# Patient Record
Sex: Female | Born: 1940 | Race: Black or African American | Hispanic: No | Marital: Single | Smoking: Former smoker
Health system: Southern US, Community
[De-identification: ages and names within clinical notes are randomized; demographics above are authoritative.]

## PROBLEM LIST (undated history)

## (undated) DIAGNOSIS — E785 Hyperlipidemia, unspecified: Secondary | ICD-10-CM

## (undated) DIAGNOSIS — I10 Essential (primary) hypertension: Secondary | ICD-10-CM

## (undated) DIAGNOSIS — G4730 Sleep apnea, unspecified: Secondary | ICD-10-CM

## (undated) DIAGNOSIS — J449 Chronic obstructive pulmonary disease, unspecified: Secondary | ICD-10-CM

## (undated) HISTORY — PX: DILATION AND CURETTAGE OF UTERUS: SHX78

## (undated) HISTORY — DX: Essential (primary) hypertension: I10

## (undated) HISTORY — DX: Sleep apnea, unspecified: G47.30

## (undated) HISTORY — PX: APPENDECTOMY: SHX54

## (undated) HISTORY — DX: Hyperlipidemia, unspecified: E78.5

---

## 2020-03-21 ENCOUNTER — Institutional Professional Consult (permissible substitution): Payer: Medicare Other | Admitting: Pulmonary Disease

## 2020-04-16 ENCOUNTER — Other Ambulatory Visit: Payer: Self-pay

## 2020-04-16 ENCOUNTER — Ambulatory Visit (INDEPENDENT_AMBULATORY_CARE_PROVIDER_SITE_OTHER): Payer: Medicare Other | Admitting: Pulmonary Disease

## 2020-04-16 ENCOUNTER — Encounter: Payer: Self-pay | Admitting: Pulmonary Disease

## 2020-04-16 VITALS — BP 138/78 | HR 94 | Temp 99.5°F | Ht 64.0 in | Wt 215.0 lb

## 2020-04-16 DIAGNOSIS — G4733 Obstructive sleep apnea (adult) (pediatric): Secondary | ICD-10-CM | POA: Insufficient documentation

## 2020-04-16 DIAGNOSIS — J449 Chronic obstructive pulmonary disease, unspecified: Secondary | ICD-10-CM

## 2020-04-16 DIAGNOSIS — J9611 Chronic respiratory failure with hypoxia: Secondary | ICD-10-CM | POA: Insufficient documentation

## 2020-04-16 DIAGNOSIS — Z9989 Dependence on other enabling machines and devices: Secondary | ICD-10-CM

## 2020-04-16 NOTE — Assessment & Plan Note (Signed)
We spent about 20 minutes troubleshooting her CPAP machine.  I decreased the ramp time to 0 minutes, her CPAP is set at 18 cm, I showed her how to set up the oxygen connection to her CPAP hose so that she does not have a leak.  I feel that she should be able to use her machine with these adjustments.I have demonstrated her her nasal mask and hose are new.  We will send in a prescription to [facility] to provide her new supplies. We will also try to obtain her old study from [facility] or from [place] We will also try to obtain a CPAP download on her machine at 18 cm and make adjustments if needed  Weight loss encouraged, compliance with goal of at least 4-6 hrs every night is the expectation. Advised against medications with sedative side effects Cautioned against driving when sleepy - understanding that sleepiness will vary on a day to day basis

## 2020-04-16 NOTE — Assessment & Plan Note (Signed)
She is maintained on 3 L of oxygen. In the future we can consider POC

## 2020-04-16 NOTE — Patient Instructions (Signed)
Prescription to [facility] to renew CPAP supplies and obtain download on CPAP machine.  Obtain sleep studies from [place] or from [place] [facility] in [place] PFTs

## 2020-04-16 NOTE — Progress Notes (Addendum)
   Subjective:    Patient ID: Jody Elliott, female    DOB: 04-17-1941, 79 y.o.   MRN: [id number]  HPI  This is a 79 year old woman presents to establish care for COPD and obstructive sleep apnea.  She is a resident at [facility] assisted living facility at [place]. She had 2 sleep studies done in [place] and one in [place] but none of these are available to me at the time of dictation.  She brings her CPAP machine with me and has a lot of issues related to the machine, I note that the machine is set at 18 cm with a ramp time of 15 minutes.  She has a nasal mask.  She has an attachment for oxygen to [facility] but this does not fit well with the house she used to live in [place] and had a prolonged hospitalization and moved to [place] with her son where she had another hospitalization at [facility].  She apparently had an episode of mechanical ventilation and has been maintained on 3 L of oxygen since then. She has a leak from the hose and repeated nocturnal awakenings related to this  Epworth sleepiness score is 7 Bedtime is around 10 PM , sleep latency is minimal she reports 1-2 nocturnal awakenings and is out of bed at 6:45 AM with dryness of mouth in spite of using her humidifier She reports increased bipedal edema but denies wheezing.  She is maintained on a regimen of Spiriva and Xopenex nebs. She takes 20 mg of Lasix daily.  Medication review shows lisinopril   Significant tests/ events reviewed Split NPSG 09/2017 ([place]) >> AHI 55/h, >> CPAP 16 cm + 3 L O2 >> inadequate titration >> 18 cm recommended ONO 10/2017 >> on RA, sig desaturations   Past Medical History:  Diagnosis Date  . Hyperlipidemia   . Hypertension   . Sleep apnea    History of appendectomy No Known Allergies  Social -she now lives in assisted living, she has family in [place], her son lives in [place], she smoked about a pack per day before she quit in 2019, about 60 pack years,  she quit alcohol in 2020  Review of Systems Shortness of breath with activity,  weight gain of 10 pounds,  joint stiffness,  feet swelling    Objective:   Physical Exam  [place],elderly, obese, in no distress ENT - no lesions, no post nasal drip Neck: No JVD, no thyromegaly, no carotid bruits Lungs: no use of accessory muscles, no dullness to percussion, decreased without rales or rhonchi  Cardiovascular: Rhythm regular, heart sounds  normal, no murmurs or gallops, no peripheral edema Musculoskeletal: No deformities, no cyanosis or clubbing , no tremors        Assessment & Plan:

## 2020-04-16 NOTE — Assessment & Plan Note (Signed)
We will try to obtain her PFTs from [place] fear, if unable to obtain then we will redo spirometry pre and post and obtain chest x-ray. Meanwhile she will continue on Spiriva and Xopenex nebs. If needed we will consider stepping up to triple therapy

## 2020-10-30 ENCOUNTER — Ambulatory Visit: Payer: Medicare Other | Admitting: Pulmonary Disease

## 2020-11-08 ENCOUNTER — Encounter: Payer: Self-pay | Admitting: Adult Health

## 2020-11-08 ENCOUNTER — Other Ambulatory Visit: Payer: Self-pay

## 2020-11-08 ENCOUNTER — Ambulatory Visit (INDEPENDENT_AMBULATORY_CARE_PROVIDER_SITE_OTHER): Payer: Medicare Other | Admitting: Adult Health

## 2020-11-08 ENCOUNTER — Ambulatory Visit
Admission: RE | Admit: 2020-11-08 | Discharge: 2020-11-08 | Disposition: A | Discharge status: Home / Self Care | Payer: Medicare Other | Source: Ambulatory Visit | Attending: Adult Health | Admitting: Adult Health

## 2020-11-08 VITALS — BP 132/72 | HR 104 | Temp 97.5°F | Ht <= 58 in | Wt 225.1 lb

## 2020-11-08 DIAGNOSIS — R0602 Shortness of breath: Secondary | ICD-10-CM | POA: Insufficient documentation

## 2020-11-08 DIAGNOSIS — G4733 Obstructive sleep apnea (adult) (pediatric): Secondary | ICD-10-CM | POA: Diagnosis not present

## 2020-11-08 DIAGNOSIS — J9611 Chronic respiratory failure with hypoxia: Secondary | ICD-10-CM

## 2020-11-08 DIAGNOSIS — J449 Chronic obstructive pulmonary disease, unspecified: Secondary | ICD-10-CM | POA: Diagnosis not present

## 2020-11-08 DIAGNOSIS — Z9989 Dependence on other enabling machines and devices: Secondary | ICD-10-CM

## 2020-11-08 NOTE — Patient Instructions (Addendum)
Continue on Spiriva 1 puff daily  Continue on Oxygen 3l/m  Continue on CPAP At bedtime  , wear all night .  Work on healthy weight .  Activity as tolerated  Follow up with Dr. Vassie Loll  In 6 months and As needed   Chest xray today .

## 2020-11-08 NOTE — Assessment & Plan Note (Signed)
Continue on CPAP at bedtime.  CPAP download requested  Plan  Patient Instructions  Continue on Spiriva 1 puff daily  Continue on Oxygen 3l/m  Continue on CPAP At bedtime  , wear all night .  Work on healthy weight .  Activity as tolerated  Follow up with Dr. Vassie Loll  In 6 months and As needed   Chest xray today .

## 2020-11-08 NOTE — Assessment & Plan Note (Signed)
Appears compensated on present regimen. Chest x-ray today  Plan  Patient Instructions  Continue on Spiriva 1 puff daily  Continue on Oxygen 3l/m  Continue on CPAP At bedtime  , wear all night .  Work on healthy weight .  Activity as tolerated  Follow up with Dr. Vassie Loll  In 6 months and As needed   Chest xray today .

## 2020-11-08 NOTE — Progress Notes (Signed)
@Patient  ID: , female    DOB: 03-19-1941, 80 y.o.   MRN: [id number]  Chief Complaint  Patient presents with  . Follow-up    Referring provider: [id number]*  HPI: 80 year old female former smoker seen for pulmonary consult April 16, 2020 to establish for COPD and obstructive sleep apnea Lives in assisted living facility. Previous history of critical illness during hospitalization with acute respiratory failure requiring mechanical ventilatory support and oxygen.  TEST/EVENTS :  Split NPSG 09/2017 ([place]) >> AHI 55/h, >> CPAP 16 cm + 3 L O2 >> inadequate titration >> 18 cm recommended ONO 10/2017 >> on RA, sig desaturations   11/08/2020 Follow up : COPD , OSA, O2 RF  Patient returns for a 74-month follow-up.  Patient has underlying COPD. Says overall breathing doing okay since last visit. No ER or hospitalizations. Likes her Assisted living . Walks with rolling walker with bench. Remains on Oxygen 3l/m 24/7 .  She remains on Spiriva daily., has been on this for long time.  Is on ACE inhibitor but denies significant cough. Covid vaccine up to date.   She has obstructive sleep apnea is on nocturnal CPAP.  Wears CPAP each night for 8-9 hr each night . Says it works well. Does not sleep without it .  CPAP download requested.   Has Hypertension and chronic ankle edema on ACE inhibitor and Lasix . Denies increased leg swelling .   Appetite is good , likes to eat.     No Known Allergies   There is no immunization history on file for this patient.  Past Medical History:  Diagnosis Date  . Hyperlipidemia   . Hypertension   . Sleep apnea     Tobacco History: Social History   Tobacco Use  Smoking Status Former Smoker  . Packs/day: 1.00  . Years: 20.00  . Pack years: 20.00  . Types: Cigarettes  . Quit date: 2020  . Years since quitting: 2.2  Smokeless Tobacco Never Used   Counseling given: Not Answered   Outpatient Medications Prior to  Visit  Medication Sig Dispense Refill  . acetaminophen (TYLENOL) 325 MG tablet Take 650 mg by mouth every 6 (six) hours as needed.    2021 aspirin EC 81 MG tablet Take 81 mg by mouth daily. Swallow whole.    . Cholecalciferol (VITAMIN D3) 10 MCG (400 UNIT) CAPS Take by mouth.    . furosemide (LASIX) 20 MG tablet Take 20 mg by mouth daily.    Marland Kitchen guaifenesin (ROBITUSSIN) 100 MG/5ML syrup Take 200 mg by mouth 3 (three) times daily as needed for cough.    . levalbuterol (XOPENEX) 1.25 MG/0.5ML nebulizer solution Take 1.25 mg by nebulization every 4 (four) hours as needed for wheezing or shortness of breath.    . lisinopril (ZESTRIL) 10 MG tablet Take 10 mg by mouth daily.    . Magnesium Hydroxide (MILK OF MAGNESIA PO) Take 400 mg by mouth.    . pravastatin (PRAVACHOL) 20 MG tablet Take 20 mg by mouth daily.    Marland Kitchen SPIRIVA HANDIHALER 18 MCG inhalation capsule 1 capsule daily.     No facility-administered medications prior to visit.     Review of Systems:   Constitutional:   No  weight loss, night sweats,  Fevers, chills,  +fatigue, or  lassitude.  HEENT:   No headaches,  Difficulty swallowing,  Tooth/dental problems, or  Sore throat,  No sneezing, itching, ear ache, nasal congestion, post nasal drip,   CV:  No chest pain,  Orthopnea, PND, swelling in lower extremities, anasarca, dizziness, palpitations, syncope.   GI  No heartburn, indigestion, abdominal pain, nausea, vomiting, diarrhea, change in bowel habits, loss of appetite, bloody stools.   Resp:    No chest wall deformity  Skin: no rash or lesions.  GU: no dysuria, change in color of urine, no urgency or frequency.  No flank pain, no hematuria   MS:  No joint pain or swelling.  No decreased range of motion.  No back pain.    Physical Exam  BP 132/72 (BP Location: Left Arm, Cuff Size: Normal)   Pulse (!) 104   Temp (!) 97.5 F (36.4 C) (Temporal)   Ht 4\' 10"  (1.473 m)   Wt 225 lb 1.6 oz (102.1 kg)   SpO2 100%    BMI 47.05 kg/m   GEN: A/Ox3; pleasant , NAD, elderly on o2 , rolling walker    HEENT:  [place]/AT,   NOSE-clear, THROAT-clear, no lesions, no postnasal drip or exudate noted.   NECK:  Supple w/ fair ROM; no JVD; normal carotid impulses w/o bruits; no thyromegaly or nodules palpated; no lymphadenopathy.    RESP  Clear  P & A; w/o, wheezes/ rales/ or rhonchi. no accessory muscle use, no dullness to percussion  CARD:  RRR, no m/r/g, 1+ peripheral edema, pulses intact, no cyanosis or clubbing.  GI:   Soft & nt; nml bowel sounds; no organomegaly or masses detected.   Musco: Warm bil, no deformities or joint swelling noted.   Neuro: alert, no focal deficits noted.    Skin: Warm, no lesions or rashes    Lab Results:  CBC No results found for: WBC, RBC, HGB, HCT, PLT, MCV, MCH, MCHC, RDW, LYMPHSABS, MONOABS, EOSABS, BASOSABS  BMET No results found for: NA, K, CL, CO2, GLUCOSE, BUN, CREATININE, CALCIUM, GFRNONAA, GFRAA  BNP No results found for: BNP  ProBNP No results found for: PROBNP  Imaging: No results found.    No flowsheet data found.  No results found for: NITRICOXIDE      Assessment & Plan:   COPD with chronic bronchitis and emphysema (HCC) Appears compensated on present regimen. Chest x-ray today  Plan  Patient Instructions  Continue on Spiriva 1 puff daily  Continue on Oxygen 3l/m  Continue on CPAP At bedtime  , wear all night .  Work on healthy weight .  Activity as tolerated  Follow up with Dr.  In 6 months and As needed   Chest xray today .     OSA on CPAP Continue on CPAP at bedtime.  CPAP download requested  Plan  Patient Instructions  Continue on Spiriva 1 puff daily  Continue on Oxygen 3l/m  Continue on CPAP At bedtime  , wear all night .  Work on healthy weight .  Activity as tolerated  Follow up with Dr. Vassie Loll  In 6 months and As needed   Chest xray today .      Chronic respiratory failure with hypoxia (HCC) Compensated on  oxygen  Plan  Patient Instructions  Continue on Spiriva 1 puff daily  Continue on Oxygen 3l/m  Continue on CPAP At bedtime  , wear all night .  Work on healthy weight .  Activity as tolerated  Follow up with Dr. Vassie Loll  In 6 months and As needed   Chest xray today .  Rubye Oaks, NP 11/08/2020

## 2020-11-08 NOTE — Assessment & Plan Note (Signed)
Compensated on oxygen  Plan  Patient Instructions  Continue on Spiriva 1 puff daily  Continue on Oxygen 3l/m  Continue on CPAP At bedtime  , wear all night .  Work on healthy weight .  Activity as tolerated  Follow up with Dr. Vassie Loll  In 6 months and As needed   Chest xray today .

## 2020-11-13 ENCOUNTER — Telehealth: Payer: Self-pay | Admitting: *Deleted

## 2020-11-13 NOTE — Telephone Encounter (Signed)
ATC patient x1, patient was out at the time of my call.

## 2020-11-15 NOTE — Progress Notes (Signed)
Called and spoke with patient, provided results/recommendations per Tammy Parrett NP.  She verbalized understanding.  Nothing further needed.

## 2021-03-03 ENCOUNTER — Ambulatory Visit (INDEPENDENT_AMBULATORY_CARE_PROVIDER_SITE_OTHER): Payer: Medicare Other | Admitting: Internal Medicine

## 2021-03-03 ENCOUNTER — Other Ambulatory Visit: Payer: Self-pay

## 2021-03-03 ENCOUNTER — Encounter: Payer: Self-pay | Admitting: Internal Medicine

## 2021-03-03 VITALS — BP 124/70 | HR 106 | Ht 63.0 in | Wt 223.0 lb

## 2021-03-03 DIAGNOSIS — R079 Chest pain, unspecified: Secondary | ICD-10-CM | POA: Diagnosis not present

## 2021-03-03 DIAGNOSIS — I5043 Acute on chronic combined systolic (congestive) and diastolic (congestive) heart failure: Secondary | ICD-10-CM

## 2021-03-03 DIAGNOSIS — R609 Edema, unspecified: Secondary | ICD-10-CM | POA: Diagnosis not present

## 2021-03-03 DIAGNOSIS — J9611 Chronic respiratory failure with hypoxia: Secondary | ICD-10-CM

## 2021-03-03 NOTE — Patient Instructions (Signed)
Medication Instructions:  Your physician recommends that you continue on your current medications as directed. Please refer to the Current Medication list given to you today.  *If you need a refill on your cardiac medications before your next appointment, please call your pharmacy*   Lab Work: CBC BMET TSH LIPID BNP If you have labs (blood work) drawn today and your tests are completely normal, you will receive your results only by: MyChart Message (if you have MyChart) OR A paper copy in the mail If you have any lab test that is abnormal or we need to change your treatment, we will call you to review the results.   Testing/Procedures: Your physician has requested that you have an echocardiogram. Echocardiography is a painless test that uses sound waves to create images of your heart. It provides your doctor with information about the size and shape of your heart and how well your heart's chambers and valves are working. This procedure takes approximately one hour. There are no restrictions for this procedure.    Follow-Up: At [facility], you and your health needs are our priority.  As part of our continuing mission to provide you with exceptional heart care, we have created designated Provider Care Teams.  These Care Teams include your primary Cardiologist (physician) and Advanced Practice Providers (APPs -  Physician Assistants and Nurse Practitioners) who all work together to provide you with the care you need, when you need it.  We recommend signing up for the patient portal called "MyChart".  Sign up information is provided on this After Visit Summary.  MyChart is used to connect with patients for Virtual Visits (Telemedicine).  Patients are able to view lab/test results, encounter notes, upcoming appointments, etc.  Non-urgent messages can be sent to your provider as well.   To learn more about what you can do with MyChart, go to [email or web address].    Your next  appointment:   Follow Up: TO BE DETERMINED   Other Instructions:

## 2021-03-03 NOTE — Progress Notes (Signed)
Cardiology Office Note   Date:  03/03/2021   ID:  [name], DOB 1941-06-17, MRN [id number]  PCP:  Lonell Grandchild, NP  Cardiologist:   Dietrich Pates, MD   Pt referred for evaluation of possible CHF     History of Present Illness: Jody Elliott is a 80 y.o. female with a history of COPD and reporte CHF   ferred for continued cardiac care   The pt denies any prior cardiac hx    She has intermitt CP   Worse with lying at night   Questions if it is reflux    She does get SOB with activity but she is on 3L O2    Not too active    Had CXR this srping that showed some vascular congestion (ordered by pulmonary)       Current Meds  Medication Sig   acetaminophen (TYLENOL) 325 MG tablet Take 650 mg by mouth every 6 (six) hours as needed.   aspirin EC 81 MG tablet Take 81 mg by mouth daily. Swallow whole.   Cholecalciferol (VITAMIN D3) 10 MCG (400 UNIT) CAPS Take by mouth.   famotidine (PEPCID) 40 MG tablet Take 40 mg by mouth daily.   furosemide (LASIX) 20 MG tablet Take 20 mg by mouth daily.   guaifenesin (ROBITUSSIN) 100 MG/5ML syrup Take 200 mg by mouth 3 (three) times daily as needed for cough.   levalbuterol (XOPENEX) 1.25 MG/0.5ML nebulizer solution Take 1.25 mg by nebulization every 4 (four) hours as needed for wheezing or shortness of breath.   lisinopril (ZESTRIL) 10 MG tablet Take 10 mg by mouth daily.   Magnesium Hydroxide (MILK OF MAGNESIA PO) Take 400 mg by mouth.   pravastatin (PRAVACHOL) 20 MG tablet Take 20 mg by mouth daily.   SPIRIVA HANDIHALER 18 MCG inhalation capsule 1 capsule daily.   VENTOLIN HFA 108 (90 Base) MCG/ACT inhaler Inhale 2 puffs into the lungs every 4 (four) hours as needed.     Allergies:   Patient has no known allergies.   Past Medical History:  Diagnosis Date   Hyperlipidemia    Hypertension    Sleep apnea     Past Surgical History:  Procedure Laterality Date   APPENDECTOMY     DILATION AND CURETTAGE OF UTERUS       Social  History:  The patient  reports that she quit smoking about 2 years ago. Her smoking use included cigarettes. She has a 20.00 pack-year smoking history. She has never used smokeless tobacco. She reports previous alcohol use. She reports that she does not use drugs.   Family History:  The patient's family history includes Cancer in her father; Diabetes in her father and mother.    ROS:  Please see the history of present illness. All other systems are reviewed and  Negative to the above problem except as noted.    PHYSICAL EXAM: VS:  BP 124/70 (BP Location: Right Arm)   Pulse (!) 106   Ht 5\' 3"  (1.6 m)   Wt 223 lb (101.2 kg)   SpO2 99%   BMI 39.50 kg/m   GEN: Morbidly obese 80 yo in no acute distress  HEENT: normal  Neck: no JVD, carotid bruits Cardiac: RRR; no murmurs, rubs, or gallops   1+ LE edema  Respiratory:  clear to auscultation bilaterally   GI: soft, nontender, nondistended, + BS  No hepatomegaly  MS: no deformity Moving all extremities   Skin: warm and dry, no rash Neuro:  Strength and sensation are intact Psych: euthymic mood, full affect   EKG:  EKG is ordered today.  ST 101 bpm      Lipid Panel No results found for: CHOL, TRIG, HDL, CHOLHDL, VLDL, LDLCALC, LDLDIRECT    Wt Readings from Last 3 Encounters:  03/03/21 223 lb (101.2 kg)  11/08/20 225 lb 1.6 oz (102.1 kg)  04/16/20 215 lb (97.5 kg)      ASSESSMENT AND PLAN:  1  Question CHF   No work up in the past   CXR with some vascular congestion.  May reflect body habitus    I would recommm an echo to confirm Would get CBC, BMET and BNP    Watch Na   2  COPD  Severe   Will try to get into pulmonmary clinic sooner   3  OSA   Pulm needs to review CPAP   4 HCM  Check TSH and lipids   F/U base on test results     Current medicines are reviewed at length with the patient today.  The patient does not have concerns regarding medicines.  Signed, Dietrich Pates, MD  03/03/2021 9:18 AM    [facility] [address] [place], [place], [place]  [address] Phone: [phone]; Fax: [phone]

## 2021-03-12 ENCOUNTER — Ambulatory Visit (HOSPITAL_COMMUNITY): Admission: RE | Admit: 2021-03-12 | Payer: Medicare Other | Source: Ambulatory Visit

## 2021-03-21 ENCOUNTER — Ambulatory Visit: Payer: Medicare Other | Admitting: Adult Health

## 2021-03-25 ENCOUNTER — Ambulatory Visit (INDEPENDENT_AMBULATORY_CARE_PROVIDER_SITE_OTHER): Payer: Medicare Other | Admitting: Primary Care

## 2021-03-25 ENCOUNTER — Other Ambulatory Visit: Payer: Self-pay

## 2021-03-25 ENCOUNTER — Encounter: Payer: Self-pay | Admitting: Primary Care

## 2021-03-25 ENCOUNTER — Ambulatory Visit (INDEPENDENT_AMBULATORY_CARE_PROVIDER_SITE_OTHER): Payer: Medicare Other

## 2021-03-25 VITALS — BP 128/80 | HR 112 | Temp 98.1°F | Ht 63.0 in | Wt 224.0 lb

## 2021-03-25 DIAGNOSIS — R0602 Shortness of breath: Secondary | ICD-10-CM

## 2021-03-25 DIAGNOSIS — Z9989 Dependence on other enabling machines and devices: Secondary | ICD-10-CM

## 2021-03-25 DIAGNOSIS — I509 Heart failure, unspecified: Secondary | ICD-10-CM | POA: Diagnosis not present

## 2021-03-25 DIAGNOSIS — J449 Chronic obstructive pulmonary disease, unspecified: Secondary | ICD-10-CM | POA: Diagnosis not present

## 2021-03-25 DIAGNOSIS — G4733 Obstructive sleep apnea (adult) (pediatric): Secondary | ICD-10-CM

## 2021-03-25 LAB — BRAIN NATRIURETIC PEPTIDE: Pro B Natriuretic peptide (BNP): 46 pg/mL (ref 0.0–100.0)

## 2021-03-25 MED ORDER — PREDNISONE 10 MG PO TABS: ORAL_TABLET | ORAL | 0 refills | Status: DC

## 2021-03-25 MED ORDER — AMOXICILLIN-POT CLAVULANATE 875-125 MG PO TABS: 1.0000 | ORAL_TABLET | Freq: Two times a day (BID) | ORAL | 0 refills | Status: DC

## 2021-03-25 NOTE — Progress Notes (Signed)
Please let patient know BNP was normal, no change recommended to diuretics. CXR showed mild bibasilar opacities which could represent atelectasis, aspiration or pneumonia. Recommend treating with course Augmentin 1 tab BID x 7 days (ill send). Continue prednisone as prescribed.

## 2021-03-25 NOTE — Patient Instructions (Addendum)
Seen today for wheezing, suspect there is an underlying component of heart failure. You need to rescheduled echocardiogram with cardiology. We will put you on a short course or oral steroids to see if this helps with wheezing. Continue Spiriva handihaler, as needed levalbuterol, supplemental oxygen and CPAP.   Orders: Labs (BNP today) CXR today Spirometry in 4 weeks    Follow-up: 4 weeks with Dr. Vassie Loll with spirometry and FENO (sept 20, 21 or 22)  / bring vaccination card with you to next appointment

## 2021-03-25 NOTE — Assessment & Plan Note (Addendum)
-   Patient has had increased wheezing over the last several week without any other respiratory symptoms. COPD does not appear overtly exacerbated but she did have some wheezing on exam. We will send in 5 days course of oral prednisone and check CXR and BNP today. Concern for underlying heart failure, she has leg swelling. She is compliant with Lasix 20mg . She inadvertently missed echocardiogram scheduled for July. Recommend we check spirometry with FENO at follow-up in 4 weeks (she will need to bring in her vaccination card with her). Continue Spiriva handihaler, as needed levalbuterol, supplemental oxygen at 3L/min.

## 2021-03-25 NOTE — Addendum Note (Signed)
Addended by: Glenford Bayley on: 03/25/2021 04:42 PM   Modules accepted: Orders

## 2021-03-25 NOTE — Progress Notes (Signed)
CXR showed mild bibasilar opacities which could represent atelectasis, aspiration or pneumonia. Mild cardiomegaly and hyperinflation. Due to acute wheezing we will treat for suspected aspiration/pna with Augmentin 875-125mg  BID x 7 days. BNP was normal- not adjusted to diuretics needed.

## 2021-03-25 NOTE — Progress Notes (Signed)
@Patient  ID: , female    DOB: [birth year], 80 y.o.   MRN: [id number]  Chief Complaint  Patient presents with   Follow-up    Patient reports shortness of breath and wheezing worse in last few weeks.      Referring provider: [id number], NP  HPI: 80 year old female former smoker seen for pulmonary consult April 16, 2020 to establish for COPD and obstructive sleep apnea. Patient of Dr. April 18, 2020, last seen by NP in March 2022. She lives in assisted living facility.   Previous [facility] pulmonary encounter: 11/08/2020 Follow up : COPD , OSA, O2 RF  Patient returns for a 70-month follow-up.  Patient has underlying COPD. Says overall breathing doing okay since last visit. No ER or hospitalizations. Likes her Assisted living . Walks with rolling walker with bench. Remains on Oxygen 3l/m 24/7 .  She remains on Spiriva daily., has been on this for long time.  Is on ACE inhibitor but denies significant cough. Covid vaccine up to date.   She has obstructive sleep apnea is on nocturnal CPAP.  Wears CPAP each night for 8-9 hr each night . Says it works well. Does not sleep without it .  CPAP download requested.   Has Hypertension and chronic ankle edema on ACE inhibitor and Lasix . Denies increased leg swelling .   Appetite is good , likes to eat.   03/25/2021- interim hx  Patient presents today for acute visit, cardiology wanted her to be seen d/t wheezing. She feels well today. She denies any increase in shortness of breath, chest tightness or cough. She is compliant with Spiriva Handihaler. She wears 3L oxygen continuously during the day and CPAP at night. No compliance report available today. She wears CPAP every night for approximately for 8 hours. She got a new headgear which she states does not fit like the original. She is taking lasix 20mg  daily. She has some leg swelling. Question of heat failure, no work up in the past. Appears she missed an echocardiogram scheduled in July.     TEST/EVENTS :  Split NPSG 09/2017 ([place]) >> AHI 55/h, >> CPAP 16 cm + 3 L O2 >> inadequate titration >> 18 cm recommended ONO 10/2017 >> on RA, sig desaturations  CXR- mild cardiomegaly with cedntral pulmonary vascular congestion. Mild bibasilar opacities are noted concerning for subsegmental atelectasis or possible edema.   No Known Allergies   There is no immunization history on file for this patient.  Past Medical History:  Diagnosis Date   Hyperlipidemia    Hypertension    Sleep apnea     Tobacco History: Social History   Tobacco Use  Smoking Status Former   Packs/day: 1.00   Years: 20.00   Pack years: 20.00   Types: Cigarettes   Quit date: 2020   Years since quitting: 2.6  Smokeless Tobacco Never   Counseling given: Not Answered   Outpatient Medications Prior to Visit  Medication Sig Dispense Refill   acetaminophen (TYLENOL) 325 MG tablet Take 650 mg by mouth every 6 (six) hours as needed.     aspirin EC 81 MG tablet Take 81 mg by mouth daily. Swallow whole.     Cholecalciferol (VITAMIN D3) 10 MCG (400 UNIT) CAPS Take by mouth.     famotidine (PEPCID) 40 MG tablet Take 40 mg by mouth daily.     furosemide (LASIX) 20 MG tablet Take 20 mg by mouth daily.     guaifenesin (ROBITUSSIN) 100 MG/5ML syrup Take 200  mg by mouth 3 (three) times daily as needed for cough.     latanoprost (XALATAN) 0.005 % ophthalmic solution SMARTSIG:In Eye(s)     levalbuterol (XOPENEX) 1.25 MG/0.5ML nebulizer solution Take 1.25 mg by nebulization every 4 (four) hours as needed for wheezing or shortness of breath.     lisinopril (ZESTRIL) 10 MG tablet Take 10 mg by mouth daily.     Magnesium Hydroxide (MILK OF MAGNESIA PO) Take 400 mg by mouth.     pravastatin (PRAVACHOL) 20 MG tablet Take 20 mg by mouth daily.     SPIRIVA HANDIHALER 18 MCG inhalation capsule 1 capsule daily.     VENTOLIN HFA 108 (90 Base) MCG/ACT inhaler Inhale 2 puffs into the lungs every 4 (four) hours as  needed.     amoxicillin-clavulanate (AUGMENTIN) 500-125 MG tablet Take 1 tablet by mouth 2 (two) times daily. (Patient not taking: Reported on 03/25/2021)     chlorhexidine (PERIDEX) 0.12 % solution SMARTSIG:By Mouth (Patient not taking: Reported on 03/25/2021)     No facility-administered medications prior to visit.    Review of Systems  Review of Systems  Constitutional: Negative.   HENT: Negative.    Respiratory:  Positive for wheezing. Negative for cough, chest tightness and shortness of breath.   Cardiovascular:  Positive for leg swelling.    Physical Exam  BP 128/80 (BP Location: Left Arm, Patient Position: Sitting, Cuff Size: Normal)   Pulse (!) 112   Temp 98.1 F (36.7 C) (Oral)   Ht 5\' 3"  (1.6 m)   Wt 224 lb (101.6 kg)   SpO2 99%   BMI 39.68 kg/m  Physical Exam Constitutional:      Appearance: Normal appearance.  HENT:     Mouth/Throat:     Comments: Deferred d/t masking Cardiovascular:     Rate and Rhythm: Normal rate and regular rhythm.     Comments: +1BLE edema, L>R Pulmonary:     Effort: Pulmonary effort is normal.     Breath sounds: Wheezing present.  Musculoskeletal:        General: Normal range of motion.     Comments: Rolling walker   Skin:    General: Skin is warm and dry.  Neurological:     General: No focal deficit present.     Mental Status: She is alert and oriented to person, place, and time. Mental status is at baseline.  Psychiatric:        Mood and Affect: Mood normal.        Behavior: Behavior normal.        Thought Content: Thought content normal.        Judgment: Judgment normal.     Lab Results:  CBC No results found for: WBC, RBC, HGB, HCT, PLT, MCV, MCH, MCHC, RDW, LYMPHSABS, MONOABS, EOSABS, BASOSABS  BMET No results found for: NA, K, CL, CO2, GLUCOSE, BUN, CREATININE, CALCIUM, GFRNONAA, GFRAA  BNP No results found for: BNP  ProBNP No results found for: PROBNP  Imaging: No results found.   Assessment & Plan:   COPD  with chronic bronchitis and emphysema (HCC) - Patient has had increased wheezing over the last several week without any other respiratory symptoms. COPD does not appear overtly exacerbated but she did have some wheezing on exam. We will send in 5 days course of oral prednisone and check CXR and BNP today. Concern for underlying heart failure, she has leg swelling. She is compliant with Lasix 20mg . She inadvertently missed echocardiogram scheduled for July. Recommend  we check spirometry with [facility] at follow-up in 4 weeks (she will need to bring in her vaccination card with her). Continue Spiriva handihaler, as needed levalbuterol, supplemental oxygen at 3L/min.     OSA on CPAP - Split NPSG 2019 showed severe OSA, AHI 55/hr - No compliance reports available today, she will need to bring in SD card to next visit or bring it by DME company    Glenford Bayley, NP 03/25/2021

## 2021-03-25 NOTE — Assessment & Plan Note (Signed)
-   Split NPSG 2019 showed severe OSA, AHI 55/hr - No compliance reports available today, she will need to bring in SD card to next visit or bring it by DME company

## 2021-04-17 ENCOUNTER — Other Ambulatory Visit: Payer: Self-pay

## 2021-04-17 ENCOUNTER — Ambulatory Visit (INDEPENDENT_AMBULATORY_CARE_PROVIDER_SITE_OTHER): Payer: Medicare Other | Admitting: Obstetrics & Gynecology

## 2021-04-17 ENCOUNTER — Encounter: Payer: Self-pay | Admitting: Obstetrics & Gynecology

## 2021-04-17 ENCOUNTER — Other Ambulatory Visit (HOSPITAL_COMMUNITY)
Admission: RE | Admit: 2021-04-17 | Discharge: 2021-04-17 | Disposition: A | Discharge status: Home / Self Care | Payer: Medicare Other | Source: Ambulatory Visit | Attending: Obstetrics & Gynecology | Admitting: Obstetrics & Gynecology

## 2021-04-17 VITALS — BP 122/71 | HR 98 | Ht 63.0 in | Wt 224.0 lb

## 2021-04-17 DIAGNOSIS — N898 Other specified noninflammatory disorders of vagina: Secondary | ICD-10-CM

## 2021-04-17 NOTE — Addendum Note (Signed)
Addended by: Annamarie Dawley on: 04/17/2021 10:47 AM   Modules accepted: Orders

## 2021-04-17 NOTE — Progress Notes (Signed)
   GYN VISIT Patient name: Jody Elliott MRN [id number]  Date of birth: [birth year] Chief Complaint:   Gynecologic Exam  History of Present Illness:   Jody Elliott is a 80 y.o. postmenopausal female being seen today for the following concerns:  Vaginal irritation: She notes burning and irritation for the past month.  +itching and notes the skin burns with urination.  Tried OTC yeast medication which improved only slightly.  She does note that after her shower she will apply cortizone which does help.  She just wasn't sure if this was ok and is concerned that something else is wrong.  No discharge or odor.  No vaginal bleeding. Denies pelvic pain.  Denies dysuria or hematuria.  No other acute complaints  No LMP recorded. Patient is postmenopausal.   Review of Systems:   Pertinent items are noted in HPI Denies fever/chills, dizziness, headaches, visual disturbances, fatigue, shortness of breath, chest pain, abdominal pain, vomiting, bowel movements, urination, or intercourse unless otherwise stated above.  Pertinent History Reviewed:  Reviewed past medical,surgical, social, obstetrical and family history.  Reviewed problem list, medications and allergies. Physical Assessment:   Vitals:   04/17/21 0957  BP: 122/71  Pulse: 98  Weight: 224 lb (101.6 kg)  Height: 5\' 3"  (1.6 m)  Body mass index is 39.68 kg/m.       Physical Examination:   General appearance: alert, well appearing, and in no distress  Psych: mood appropriate, normal affect  Skin: warm & dry   Cardiovascular: normal heart rate noted  Respiratory: normal respiratory effort on supplemental oxygen, no distress  Abdomen: soft, obese, non-tender   Pelvic: VULVA: normal appearing vulva with no masses, tenderness or lesions, VAGINA: normal appearing atrophic vagina with normal color, minimal discharge, no lesions,CERVIX: normal appearing cervix without discharge or lesions  Extremities: 1+ edema  Chaperone:     Assessment & Plan:  1) Vaginal irritation -no acute abnormalities noted on exam -plan to r/o underlying infection and treatment accordingly - suspect dermatitis vs atrophic changes causing irritation.  Reassured pt that there was no masses/lesions or discrete abnormalities noted on exam -ok to continue with topical OTC steroid cream- if this is no longer working or worsening of symptoms, RTC   Return if symptoms worsen or fail to improve.   Faith Rogue, DO Attending Obstetrician & Gynecologist, [facility] for [facility]

## 2021-04-18 LAB — CERVICOVAGINAL ANCILLARY ONLY
Bacterial Vaginitis (gardnerella): NEGATIVE
Candida Glabrata: NEGATIVE
Candida Vaginitis: NEGATIVE
Comment: NEGATIVE
Comment: NEGATIVE
Comment: NEGATIVE

## 2021-04-22 ENCOUNTER — Telehealth: Payer: Self-pay

## 2021-04-22 NOTE — Telephone Encounter (Signed)
Called pt to relay test results. Two identifiers used. Pt confirmed understanding. 

## 2021-04-24 ENCOUNTER — Telehealth: Payer: Self-pay | Admitting: Pulmonary Disease

## 2021-04-24 NOTE — Telephone Encounter (Signed)
ATC to call [facility], was on hold for and no one answered. I have faxed last OV notes and order to number given. Nothing further needed at this time

## 2021-05-05 ENCOUNTER — Other Ambulatory Visit: Payer: Self-pay

## 2021-05-05 ENCOUNTER — Ambulatory Visit (INDEPENDENT_AMBULATORY_CARE_PROVIDER_SITE_OTHER): Payer: Medicare Other | Admitting: Pulmonary Disease

## 2021-05-05 ENCOUNTER — Ambulatory Visit: Payer: Medicare Other | Admitting: Pulmonary Disease

## 2021-05-05 DIAGNOSIS — R0602 Shortness of breath: Secondary | ICD-10-CM | POA: Diagnosis not present

## 2021-05-05 LAB — PULMONARY FUNCTION TEST
FEF 25-75 Pre: 0.31 L/sec
FEF2575-%Pred-Pre: 29 %
FEV1-%Pred-Pre: 57 %
FEV1-Pre: 0.7 L
FEV1FVC-%Pred-Pre: 81 %
FEV6-%Pred-Pre: 75 %
FEV6-Pre: 1.13 L
FEV6FVC-%Pred-Pre: 104 %
FVC-%Pred-Pre: 72 %
FVC-Pre: 1.15 L
Pre FEV1/FVC ratio: 61 %
Pre FEV6/FVC Ratio: 98 %

## 2021-05-05 NOTE — Progress Notes (Signed)
Please let patient know spirometry showed moderate obstructive lung disease, consistent with stage 2 COPD. Recommend changing Spiriva to [employer]. Please send in RX. She has an apt with Dr. Vassie Loll in November.

## 2021-05-05 NOTE — Progress Notes (Signed)
Spirometry done today. 

## 2021-05-06 ENCOUNTER — Other Ambulatory Visit: Payer: Self-pay | Admitting: *Deleted

## 2021-05-06 MED ORDER — STIOLTO RESPIMAT 2.5-2.5 MCG/ACT IN AERS: 2.0000 | INHALATION_SPRAY | Freq: Every day | RESPIRATORY_TRACT | 11 refills | Status: AC

## 2021-05-15 ENCOUNTER — Ambulatory Visit: Payer: Medicare Other | Admitting: Pulmonary Disease

## 2021-06-06 ENCOUNTER — Other Ambulatory Visit: Payer: Self-pay

## 2021-06-06 ENCOUNTER — Ambulatory Visit (HOSPITAL_COMMUNITY)
Admission: RE | Admit: 2021-06-06 | Discharge: 2021-06-06 | Disposition: A | Discharge status: Home / Self Care | Payer: Medicare Other | Source: Ambulatory Visit | Attending: Internal Medicine | Admitting: Internal Medicine

## 2021-06-06 DIAGNOSIS — R609 Edema, unspecified: Secondary | ICD-10-CM | POA: Diagnosis present

## 2021-06-06 DIAGNOSIS — R079 Chest pain, unspecified: Secondary | ICD-10-CM | POA: Insufficient documentation

## 2021-06-06 LAB — ECHOCARDIOGRAM COMPLETE
AR max vel: 1.85 cm2
AV Area VTI: 1.6 cm2
AV Area mean vel: 1.84 cm2
AV Mean grad: 9 mmHg
AV Peak grad: 17.1 mmHg
Ao pk vel: 2.07 m/s
Area-P 1/2: 4.31 cm2
S' Lateral: 2.6 cm

## 2021-06-06 NOTE — Progress Notes (Signed)
*  PRELIMINARY RESULTS* Echocardiogram 2D Echocardiogram has been performed.  Stacey Drain 06/06/2021, 12:38 PM

## 2021-06-26 ENCOUNTER — Ambulatory Visit (INDEPENDENT_AMBULATORY_CARE_PROVIDER_SITE_OTHER): Payer: Medicare Other | Admitting: Pulmonary Disease

## 2021-06-26 ENCOUNTER — Other Ambulatory Visit: Payer: Self-pay

## 2021-06-26 ENCOUNTER — Encounter: Payer: Self-pay | Admitting: Pulmonary Disease

## 2021-06-26 DIAGNOSIS — J449 Chronic obstructive pulmonary disease, unspecified: Secondary | ICD-10-CM | POA: Diagnosis not present

## 2021-06-26 DIAGNOSIS — J9611 Chronic respiratory failure with hypoxia: Secondary | ICD-10-CM

## 2021-06-26 DIAGNOSIS — Z9989 Dependence on other enabling machines and devices: Secondary | ICD-10-CM

## 2021-06-26 DIAGNOSIS — G4733 Obstructive sleep apnea (adult) (pediatric): Secondary | ICD-10-CM | POA: Diagnosis not present

## 2021-06-26 NOTE — Assessment & Plan Note (Signed)
She was just changed today from Spiriva to [facility], ideally she should be on triple therapy, we demonstrated Respimat technique to her.  We will switch her to Trelegy in the future

## 2021-06-26 NOTE — Assessment & Plan Note (Signed)
Continue 3 L of oxygen at all times 

## 2021-06-26 NOTE — Assessment & Plan Note (Signed)
CPAP download will be reviewed. She is compliant by history and CPAP is certainly helped improve her daytime somnolence and fatigue.  CPAP supplies will be renewed as needed  Weight loss encouraged, compliance with goal of at least 4-6 hrs every night is the expectation. Advised against medications with sedative side effects Cautioned against driving when sleepy - understanding that sleepiness will vary on a day to day basis

## 2021-06-26 NOTE — Progress Notes (Signed)
   Subjective:    Patient ID: Jody Elliott, female    DOB: 11/11/1940, 80 y.o.   MRN: [id number]  HPI  80 yo for FU of COPD , chronic resp failure and obstructive sleep apnea.  She is a resident at [facility] assisted living facility at [place] -on CPAP 18 cm -on 3L O2 since 2020  PMH -  Hypertension and chronic ankle edema on ACE inhibitor and Lasix .    Initial OV 03/2020 Last OV 03/2021 >> bibasal opacities >> Augmentin  We reviewed spirometry which confirmed severe airway obstruction, she had been on Spiriva for many years and this was changed to [employer] she is not sure of how to use this new Respimat.  Breathing is otherwise at baseline. She reports chronic bipedal edema no cough or wheezing. She is compliant with CPAP, she reports issues with Velcro of the mask wearing off and she has to replace it  frequently   Significant tests/ events reviewed Spirometry 04/2021 ratio 61, FEV1 0.70/57%, FVC 72%, moderate airway obstruction, bronchodilator response was not assessed  Split NPSG 09/2017 ([place]) >> AHI 55/h, >> CPAP 16 cm + 3 L O2 >> inadequate titration >> 18 cm recommended ONO 10/2017 >> on RA, sig desaturations   Echo 05/2021 nml LVEF , mild AS  Review of Systems neg for any significant sore throat, dysphagia, itching, sneezing, nasal congestion or excess/ purulent secretions, fever, chills, sweats, unintended wt loss, pleuritic or exertional cp, hempoptysis, orthopnea pnd or change in chronic leg swelling. Also denies presyncope, palpitations, heartburn, abdominal pain, nausea, vomiting, diarrhea or change in bowel or urinary habits, dysuria,hematuria, rash, arthralgias, visual complaints, headache, numbness weakness or ataxia.     Objective:   Physical Exam   Gen. Pleasant, elderly,obese, in no distress ENT - no lesions, no post nasal drip Neck: No JVD, no thyromegaly, no carotid bruits Lungs: no use of accessory muscles, no dullness to percussion, decreased  without rales or rhonchi  Cardiovascular: Rhythm regular, heart sounds  normal, no murmurs or gallops, ++peripheral edema Musculoskeletal: No deformities, no cyanosis or clubbing , no tremors        Assessment & Plan:

## 2021-06-26 NOTE — Patient Instructions (Signed)
We will check CPAP report Continue on stiolto, if you have trouble let us know

## 2021-08-13 ENCOUNTER — Observation Stay (HOSPITAL_COMMUNITY)
Admission: EM | Admit: 2021-08-13 | Discharge: 2021-08-14 | Disposition: A | Discharge status: Home / Self Care | Payer: Medicare Other | Attending: Internal Medicine | Admitting: Internal Medicine

## 2021-08-13 ENCOUNTER — Emergency Department (HOSPITAL_COMMUNITY): Payer: Medicare Other

## 2021-08-13 ENCOUNTER — Encounter (HOSPITAL_COMMUNITY): Payer: Self-pay

## 2021-08-13 ENCOUNTER — Other Ambulatory Visit: Payer: Self-pay

## 2021-08-13 DIAGNOSIS — J441 Chronic obstructive pulmonary disease with (acute) exacerbation: Secondary | ICD-10-CM | POA: Diagnosis not present

## 2021-08-13 DIAGNOSIS — Z79899 Other long term (current) drug therapy: Secondary | ICD-10-CM | POA: Diagnosis not present

## 2021-08-13 DIAGNOSIS — Z9989 Dependence on other enabling machines and devices: Secondary | ICD-10-CM

## 2021-08-13 DIAGNOSIS — J9611 Chronic respiratory failure with hypoxia: Secondary | ICD-10-CM | POA: Diagnosis not present

## 2021-08-13 DIAGNOSIS — Z20822 Contact with and (suspected) exposure to covid-19: Secondary | ICD-10-CM | POA: Insufficient documentation

## 2021-08-13 DIAGNOSIS — I10 Essential (primary) hypertension: Secondary | ICD-10-CM | POA: Diagnosis not present

## 2021-08-13 DIAGNOSIS — Z87891 Personal history of nicotine dependence: Secondary | ICD-10-CM | POA: Insufficient documentation

## 2021-08-13 DIAGNOSIS — Z7982 Long term (current) use of aspirin: Secondary | ICD-10-CM | POA: Insufficient documentation

## 2021-08-13 DIAGNOSIS — R0602 Shortness of breath: Secondary | ICD-10-CM | POA: Diagnosis present

## 2021-08-13 DIAGNOSIS — G4733 Obstructive sleep apnea (adult) (pediatric): Secondary | ICD-10-CM

## 2021-08-13 HISTORY — DX: Chronic obstructive pulmonary disease, unspecified: J44.9

## 2021-08-13 LAB — RESP PANEL BY RT-PCR (FLU A&B, COVID) ARPGX2
Influenza A by PCR: NEGATIVE
Influenza B by PCR: NEGATIVE
SARS Coronavirus 2 by RT PCR: NEGATIVE

## 2021-08-13 LAB — CBC
HCT: 34.4 % — ABNORMAL LOW (ref 36.0–46.0)
Hemoglobin: 10.9 g/dL — ABNORMAL LOW (ref 12.0–15.0)
MCH: 27.9 pg (ref 26.0–34.0)
MCHC: 31.7 g/dL (ref 30.0–36.0)
MCV: 88 fL (ref 80.0–100.0)
Platelets: 199 10*3/uL (ref 150–400)
RBC: 3.91 MIL/uL (ref 3.87–5.11)
RDW: 14.6 % (ref 11.5–15.5)
WBC: 6.1 10*3/uL (ref 4.0–10.5)
nRBC: 0 % (ref 0.0–0.2)

## 2021-08-13 LAB — BASIC METABOLIC PANEL
Anion gap: 10 (ref 5–15)
BUN: 37 mg/dL — ABNORMAL HIGH (ref 8–23)
CO2: 22 mmol/L (ref 22–32)
Calcium: 8.5 mg/dL — ABNORMAL LOW (ref 8.9–10.3)
Chloride: 107 mmol/L (ref 98–111)
Creatinine, Ser: 1.02 mg/dL — ABNORMAL HIGH (ref 0.44–1.00)
GFR, Estimated: 56 mL/min — ABNORMAL LOW (ref >60)
Glucose, Bld: 137 mg/dL — ABNORMAL HIGH (ref 70–99)
Potassium: 4.5 mmol/L (ref 3.5–5.1)
Sodium: 139 mmol/L (ref 135–145)

## 2021-08-13 LAB — TROPONIN I (HIGH SENSITIVITY)
Troponin I (High Sensitivity): 18 ng/L — ABNORMAL HIGH (ref <18)
Troponin I (High Sensitivity): 5 ng/L (ref <18)

## 2021-08-13 LAB — BRAIN NATRIURETIC PEPTIDE: B Natriuretic Peptide: 573 pg/mL — ABNORMAL HIGH (ref 0.0–100.0)

## 2021-08-13 MED ORDER — IPRATROPIUM-ALBUTEROL 0.5-2.5 (3) MG/3ML IN SOLN: 3.0000 mL | RESPIRATORY_TRACT | Status: DC | PRN

## 2021-08-13 MED ORDER — IPRATROPIUM-ALBUTEROL 0.5-2.5 (3) MG/3ML IN SOLN
3.0000 mL | Freq: Once | RESPIRATORY_TRACT | Status: AC
  Administered 2021-08-13: 14:00:00 3 mL via RESPIRATORY_TRACT
  Filled 2021-08-13: qty 3

## 2021-08-13 MED ORDER — POLYETHYLENE GLYCOL 3350 17 G PO PACK: 17.0000 g | PACK | Freq: Every day | ORAL | Status: DC | PRN

## 2021-08-13 MED ORDER — ASPIRIN EC 81 MG PO TBEC
81.0000 mg | DELAYED_RELEASE_TABLET | Freq: Every day | ORAL | Status: DC
  Administered 2021-08-14: 09:00:00 81 mg via ORAL
  Filled 2021-08-13: qty 1

## 2021-08-13 MED ORDER — GUAIFENESIN-DM 100-10 MG/5ML PO SYRP
10.0000 mL | ORAL_SOLUTION | Freq: Three times a day (TID) | ORAL | Status: DC
  Administered 2021-08-13 – 2021-08-14 (×2): 10 mL via ORAL
  Filled 2021-08-13 (×2): qty 10

## 2021-08-13 MED ORDER — ONDANSETRON HCL 4 MG/2ML IJ SOLN: 4.0000 mg | Freq: Four times a day (QID) | INTRAMUSCULAR | Status: DC | PRN

## 2021-08-13 MED ORDER — ONDANSETRON HCL 4 MG PO TABS: 4.0000 mg | ORAL_TABLET | Freq: Four times a day (QID) | ORAL | Status: DC | PRN

## 2021-08-13 MED ORDER — LISINOPRIL 10 MG PO TABS
10.0000 mg | ORAL_TABLET | Freq: Every day | ORAL | Status: DC
  Administered 2021-08-14: 09:00:00 10 mg via ORAL
  Filled 2021-08-13: qty 1

## 2021-08-13 MED ORDER — ACETAMINOPHEN 650 MG RE SUPP: 650.0000 mg | Freq: Four times a day (QID) | RECTAL | Status: DC | PRN

## 2021-08-13 MED ORDER — FAMOTIDINE 20 MG PO TABS
40.0000 mg | ORAL_TABLET | Freq: Every day | ORAL | Status: DC
  Administered 2021-08-13 – 2021-08-14 (×2): 40 mg via ORAL
  Filled 2021-08-13 (×2): qty 2

## 2021-08-13 MED ORDER — LATANOPROST 0.005 % OP SOLN
1.0000 [drp] | Freq: Every day | OPHTHALMIC | Status: DC
  Administered 2021-08-13: 22:00:00 1 [drp] via OPHTHALMIC
  Filled 2021-08-13: qty 2.5

## 2021-08-13 MED ORDER — METHYLPREDNISOLONE SODIUM SUCC 125 MG IJ SOLR
125.0000 mg | Freq: Once | INTRAMUSCULAR | Status: AC
  Administered 2021-08-13: 14:00:00 125 mg via INTRAVENOUS
  Filled 2021-08-13: qty 2

## 2021-08-13 MED ORDER — ACETAMINOPHEN 325 MG PO TABS: 650.0000 mg | ORAL_TABLET | Freq: Four times a day (QID) | ORAL | Status: DC | PRN

## 2021-08-13 MED ORDER — IPRATROPIUM-ALBUTEROL 0.5-2.5 (3) MG/3ML IN SOLN
3.0000 mL | Freq: Four times a day (QID) | RESPIRATORY_TRACT | Status: DC
  Administered 2021-08-13 – 2021-08-14 (×2): 3 mL via RESPIRATORY_TRACT
  Filled 2021-08-13 (×3): qty 3

## 2021-08-13 MED ORDER — ENOXAPARIN SODIUM 40 MG/0.4ML IJ SOSY
40.0000 mg | PREFILLED_SYRINGE | Freq: Every day | INTRAMUSCULAR | Status: DC
  Administered 2021-08-13: 23:00:00 40 mg via SUBCUTANEOUS
  Filled 2021-08-13: qty 0.4

## 2021-08-13 MED ORDER — SODIUM CHLORIDE 0.9 % IV SOLN
500.0000 mg | INTRAVENOUS | Status: AC
  Administered 2021-08-13: 19:00:00 500 mg via INTRAVENOUS
  Filled 2021-08-13: qty 5

## 2021-08-13 MED ORDER — AZITHROMYCIN 250 MG PO TABS: 500.0000 mg | ORAL_TABLET | Freq: Every day | ORAL | Status: DC

## 2021-08-13 MED ORDER — FUROSEMIDE 20 MG PO TABS
20.0000 mg | ORAL_TABLET | Freq: Every day | ORAL | Status: DC
  Administered 2021-08-14: 09:00:00 20 mg via ORAL
  Filled 2021-08-13: qty 1

## 2021-08-13 MED ORDER — PRAVASTATIN SODIUM 10 MG PO TABS: 20.0000 mg | ORAL_TABLET | Freq: Every day | ORAL | Status: DC

## 2021-08-13 NOTE — ED Triage Notes (Signed)
Pt resident of [facility].  C/O sob for past 2 days.  Says recently has been waking up sob early in the morning.  Reports history of copd.  Also c/o nausea yesterday.  PCP saw her today and sent her out for copd exacerbation.  Facility gave albuterol prior to ems arrival.  EMS reports lungs clear.  Pt on o2 at 4liter continuously.  EMS started IV  and transported pt to ed.  Denies any pain.  Reports feels "little" sob.

## 2021-08-13 NOTE — Progress Notes (Signed)
Pt assisted to bedside commode without incident. 

## 2021-08-13 NOTE — H&P (Signed)
History and Physical    Jody Elliott WUJ:[id number] DOB: [birth year] DOA: 08/13/2021  PCP: Wilkie Aye, NP   Patient coming from: [facility]  I have personally briefly reviewed patient's old medical records in [facility]  Chief Complaint: Difficulty breathing  HPI: Jody Elliott is a 81 y.o. female with medical history significant for COPD with chronic respiratory failure on 4 L, OSA on CPAP, HTN.  Patient presented to the ED with complaints of difficulty breathing of 2 days duration, worsening cough of 2 days duration productive of greenish sputum.  Reports noisy breathing, wheezing.  No chest pain.  She has chronic unchanged lower extremity swelling for which she wears compression stockings.  ED Course: Temperature 98.9, heart rate 93-106.  Respiratory rate 18-29.  Blood pressure systolic 99-154.  O2 sats 95 to 100% on 4 L. Chest x-ray without acute abnormality.  EKG shows sinus tachycardia.  BNP 573.  Troponin 18. Patient was ambulated in the ED, O2 sats dropped to 90% on 4 L, and she reported dizziness.  Hence hospitalization for admission for COPD exacerbation.  Review of Systems: As per HPI all other systems reviewed and negative.  Past Medical History:  Diagnosis Date   COPD (chronic obstructive pulmonary disease) (HCC)    Hyperlipidemia    Hypertension    Sleep apnea     Past Surgical History:  Procedure Laterality Date   APPENDECTOMY     DILATION AND CURETTAGE OF UTERUS       reports that she quit smoking about 2 years ago. Her smoking use included cigarettes. She has a 20.00 pack-year smoking history. She has never used smokeless tobacco. She reports that she does not currently use alcohol. She reports that she does not use drugs.  No Known Allergies  Family History  Problem Relation Age of Onset   Diabetes Mother    Cancer Father    Diabetes Father    Prior to Admission medications   Medication Sig Start Date End Date Taking? Authorizing  Provider  acetaminophen (TYLENOL) 325 MG tablet Take 650 mg by mouth in the morning, at noon, and at bedtime.   Yes [provider]  aspirin EC 81 MG tablet Take 81 mg by mouth daily. Swallow whole.   Yes [provider]  Cholecalciferol (VITAMIN D3) 10 MCG (400 UNIT) CAPS Take by mouth.   Yes [provider]  famotidine (PEPCID) 40 MG tablet Take 40 mg by mouth daily. 02/10/21  Yes [provider]  ferrous sulfate 325 (65 FE) MG tablet Take 325 mg by mouth daily with breakfast.   Yes [provider]  furosemide (LASIX) 20 MG tablet Take 20 mg by mouth daily. 03/18/20  Yes [provider]  guaifenesin (ROBITUSSIN) 100 MG/5ML syrup Take 200 mg by mouth every 6 (six) hours as needed for cough.   Yes [provider]  latanoprost (XALATAN) 0.005 % ophthalmic solution Place 1 drop into both eyes at bedtime.   Yes [provider]  levalbuterol (XOPENEX) 1.25 MG/0.5ML nebulizer solution Take 1.25 mg by nebulization every 4 (four) hours as needed for wheezing or shortness of breath.   Yes [provider]  Lidocaine HCl 4 % CREA Apply 1 application topically in the morning, at noon, and at bedtime.   Yes [provider]  lisinopril (ZESTRIL) 10 MG tablet Take 10 mg by mouth daily.   Yes [provider]  loperamide (IMODIUM) 2 MG capsule Take 2 mg by mouth as needed for  diarrhea or loose stools.   Yes [provider]  OXYGEN Inhale 4 L into the lungs daily.   Yes [provider]  Tiotropium Bromide-Olodaterol (STIOLTO RESPIMAT) 2.5-2.5 MCG/ACT AERS Inhale 2 puffs into the lungs daily. 05/06/21  Yes Glenford Bayley, NP  traMADol (ULTRAM) 50 MG tablet Take 25 mg by mouth at bedtime.   Yes [provider]  VENTOLIN HFA 108 (90 Base) MCG/ACT inhaler Inhale 2 puffs into the lungs every 4 (four) hours as needed for wheezing. 02/18/21  Yes [provider]  vitamin B-12 (CYANOCOBALAMIN)  500 MCG tablet Take 1,000 mcg by mouth daily.   Yes [provider]  pravastatin (PRAVACHOL) 20 MG tablet Take 20 mg by mouth daily. 03/11/20   [provider]    Physical Exam: Vitals:   08/13/21 1405 08/13/21 1430 08/13/21 1445 08/13/21 1500  BP:  (!) 154/114    Pulse:  (!) 104 99 99  Resp:  20 (!) 23 18  Temp:      TempSrc:      SpO2: 100% 99% 100% 100%  Weight:        Constitutional: NAD, calm, comfortable Vitals:   08/13/21 1405 08/13/21 1430 08/13/21 1445 08/13/21 1500  BP:  (!) 154/114    Pulse:  (!) 104 99 99  Resp:  20 (!) 23 18  Temp:      TempSrc:      SpO2: 100% 99% 100% 100%  Weight:       Eyes: PERRL, lids and conjunctivae normal ENMT: Mucous membranes are moist.  Neck: normal, supple, no masses, no thyromegaly Respiratory: Reduced air entry bilaterally, expiratory wheezing bilaterally,  no crackles.  Mild increased work of breathing, mild use of abdominal accessory muscles.   Cardiovascular: Regular rate and rhythm, no murmurs / rubs / gallops.  Trace bilateral extremity edema.  Lower extremities warm and well-perfused. Abdomen: no tenderness, no masses palpated. No hepatosplenomegaly. Bowel sounds positive.  Musculoskeletal: no clubbing / cyanosis. No joint deformity upper and lower extremities. Good ROM, no contractures. Normal muscle tone.  Skin: no rashes, lesions, ulcers. No induration Neurologic: No apparent cranial abnormality, moving EXTR spontaneously. Psychiatric: Normal judgment and insight. Alert and oriented x 3. Normal mood.   Labs on Admission: I have personally reviewed following labs and imaging studies  CBC: Recent Labs  Lab 08/13/21 1231  WBC 6.1  HGB 10.9*  HCT 34.4*  MCV 88.0  PLT 199   Basic Metabolic Panel: Recent Labs  Lab 08/13/21 1231  NA 139  K 4.5  CL 107  CO2 22  GLUCOSE 137*  BUN 37*  CREATININE 1.02*  CALCIUM 8.5*   BNP (last 3 results) Recent Labs    03/25/21 1217  PROBNP 46.0     Radiological Exams on Admission: DG Chest Portable 1 View  Result Date: 08/13/2021 CLINICAL DATA:  Shortness of breath EXAM: PORTABLE CHEST 1 VIEW COMPARISON:  07/26/2021 FINDINGS: The heart size and mediastinal contours are within normal limits. Background COPD. Patchy density at the lung bases probably reflects atelectasis. No pleural effusion. The visualized skeletal structures are unremarkable. IMPRESSION: Probable mild atelectasis at the lung bases. Electronically Signed   By: Guadlupe Spanish M.D.   On: 08/13/2021 12:21    EKG: Independently reviewed.  Sinus tachycardia rate 103.  QTc 422.  No significant change from prior.  Assessment/Plan Principal Problem:   COPD with acute exacerbation (HCC) Active Problems:   OSA on CPAP   Chronic respiratory failure with hypoxia (  HCC)   COPD with acute exacerbation-dyspnea, productive cough, wheezing and reduced air entry.  Chest x-ray without acute abnormality.  COVID influenza test pending.  Troponins 18.  BNP elevated 573, compared to 46 checked 03/2021.  Has trace, chronic unchanged lower extremity edema. Echo 05/2021 shows EF of 60 to 65% mild LVH and normal LV diastolic parameters -DuoNebs as needed and scheduled - Solu-Medrol 125 mg given, continue 60 twice daily -IV azithromycin -Mucolytic's  Chronic hypoxic respiratory failure, OSA-on 4 L at baseline.  Sats currently 95 - 100%, on home 4 L. -Resume home CPAP nightly.  Hypertension -Resume lisinopril, Lasix 20 mg daily  DVT prophylaxis: Lovenox Code Status: DNR, confirmed with patient at bedside. Family Communication: None at bedside Disposition Plan: ~ 1 - 2 days Consults called: none Admission status: obs tele   Onnie Boer MD [facility]  08/13/2021, 5:00 PM

## 2021-08-13 NOTE — ED Notes (Signed)
Pt ambulated in the hall with 4 L [place] and walker, pts O2 sats decreased to 90%, pt states, " I feel dizzy." Pt returned to her room in a wheechair, MD notified

## 2021-08-13 NOTE — ED Provider Notes (Signed)
[facility] EMERGENCY DEPARTMENT Provider Note  CSN: [id number] Arrival date & time: 08/13/21 1118    History Chief Complaint  Patient presents with   Shortness of Breath    Jody Elliott is a 80 y.o. 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 with 3 days of worsening SOB, productive cough. No fever. No chest pain. She was given albuterol prior to arrival with improvement.    Past Medical History:  Diagnosis Date   COPD (chronic obstructive pulmonary disease) (HCC)    Hyperlipidemia    Hypertension    Sleep apnea     Past Surgical History:  Procedure Laterality Date   APPENDECTOMY     DILATION AND CURETTAGE OF UTERUS      Family History  Problem Relation Age of Onset   Diabetes Mother    Cancer Father    Diabetes Father     Social History   Tobacco Use   Smoking status: Former    Packs/day: 1.00    Years: 20.00    Pack years: 20.00    Types: Cigarettes    Quit date: 2020    Years since quitting: 2.9   Smokeless tobacco: Never  Vaping Use   Vaping Use: Never used  Substance Use Topics   Alcohol use: Not Currently   Drug use: Never     Home Medications Prior to Admission medications   Medication Sig Start Date End Date Taking? Authorizing Provider  acetaminophen (TYLENOL) 325 MG tablet Take 650 mg by mouth in the morning, at noon, and at bedtime.   Yes [provider]  aspirin EC 81 MG tablet Take 81 mg by mouth daily. Swallow whole.   Yes [provider]  Cholecalciferol (VITAMIN D3) 10 MCG (400 UNIT) CAPS Take by mouth.   Yes [provider]  famotidine (PEPCID) 40 MG tablet Take 40 mg by mouth daily. 02/10/21  Yes [provider]  ferrous sulfate 325 (65 FE) MG tablet Take 325 mg by mouth daily with breakfast.   Yes [provider]  furosemide (LASIX) 20 MG tablet Take 20 mg by mouth daily. 03/18/20  Yes [provider]  guaifenesin (ROBITUSSIN) 100 MG/5ML syrup  Take 200 mg by mouth every 6 (six) hours as needed for cough.   Yes [provider]  latanoprost (XALATAN) 0.005 % ophthalmic solution Place 1 drop into both eyes at bedtime.   Yes [provider]  levalbuterol (XOPENEX) 1.25 MG/0.5ML nebulizer solution Take 1.25 mg by nebulization every 4 (four) hours as needed for wheezing or shortness of breath.   Yes [provider]  Lidocaine HCl 4 % CREA Apply 1 application topically in the morning, at noon, and at bedtime.   Yes [provider]  lisinopril (ZESTRIL) 10 MG tablet Take 10 mg by mouth daily.   Yes [provider]  loperamide (IMODIUM) 2 MG capsule Take 2 mg by mouth as needed for diarrhea or loose stools.   Yes [provider]  OXYGEN Inhale 4 L into the lungs daily.   Yes [provider]  Tiotropium Bromide-Olodaterol (STIOLTO RESPIMAT) 2.5-2.5 MCG/ACT AERS Inhale 2 puffs into the lungs daily. 05/06/21  Yes Glenford Bayley, NP  traMADol (ULTRAM) 50 MG tablet Take 25 mg by mouth at bedtime.   Yes [provider]  VENTOLIN HFA 108 (90 Base) MCG/ACT inhaler Inhale 2 puffs into the lungs every 4 (four) hours as needed for wheezing. 02/18/21  Yes  [provider]  vitamin B-12 (CYANOCOBALAMIN) 500 MCG tablet Take 1,000 mcg by mouth daily.   Yes [provider]  pravastatin (PRAVACHOL) 20 MG tablet Take 20 mg by mouth daily. 03/11/20   [provider]     Allergies    Patient has no known allergies.   Review of Systems   Review of Systems A comprehensive review of systems was completed and negative except as noted in HPI.    Physical Exam BP (!) 154/114    Pulse 99    Temp 98.9 F (37.2 C) (Oral)    Resp 18    Wt 99.8 kg    SpO2 100%    BMI 38.97 kg/m   Physical Exam Vitals and nursing note reviewed.  Constitutional:      Appearance: Normal appearance.  HENT:     Head: Normocephalic and atraumatic.     Nose: Nose normal.      Mouth/Throat:     Mouth: Mucous membranes are moist.  Eyes:     Extraocular Movements: Extraocular movements intact.     Conjunctiva/sclera: Conjunctivae normal.  Cardiovascular:     Rate and Rhythm: Normal rate.  Pulmonary:     Effort: Pulmonary effort is normal.     Breath sounds: Decreased breath sounds and wheezing present.  Abdominal:     General: Abdomen is flat.     Palpations: Abdomen is soft.     Tenderness: There is no abdominal tenderness.  Musculoskeletal:        General: No swelling. Normal range of motion.     Cervical back: Neck supple.  Skin:    General: Skin is warm and dry.  Neurological:     General: No focal deficit present.     Mental Status: She is alert.  Psychiatric:        Mood and Affect: Mood normal.     ED Results / Procedures / Treatments   Labs (all labs ordered are listed, but only abnormal results are displayed) Labs Reviewed  BASIC METABOLIC PANEL - Abnormal; Notable for the following components:      Result Value   Glucose, Bld 137 (*)    BUN 37 (*)    Creatinine, Ser 1.02 (*)    Calcium 8.5 (*)    GFR, Estimated 56 (*)    All other components within normal limits  CBC - Abnormal; Notable for the following components:   Hemoglobin 10.9 (*)    HCT 34.4 (*)    All other components within normal limits  BRAIN NATRIURETIC PEPTIDE - Abnormal; Notable for the following components:   B Natriuretic Peptide 573.0 (*)    All other components within normal limits  TROPONIN I (HIGH SENSITIVITY) - Abnormal; Notable for the following components:   Troponin I (High Sensitivity) 18 (*)    All other components within normal limits  TROPONIN I (HIGH SENSITIVITY)    EKG EKG Interpretation  Date/Time:  Wednesday August 13 2021 11:35:53 EST Ventricular Rate:  103 PR Interval:  129 QRS Duration: 84 QT Interval:  322 QTC Calculation: 422 R Axis:   81 Text Interpretation: Sinus tachycardia Borderline right axis deviation Low voltage,  precordial leads No old tracing to compare Confirmed by Susy Frizzle [phone]) on 08/13/2021 12:41:22 PM  Radiology DG Chest Portable 1 View  Result Date: 08/13/2021 CLINICAL DATA:  Shortness of breath EXAM: PORTABLE CHEST 1 VIEW COMPARISON:  07/26/2021 FINDINGS: The heart size and mediastinal contours are within normal limits. Background COPD. Patchy  density at the lung bases probably reflects atelectasis. No pleural effusion. The visualized skeletal structures are unremarkable. IMPRESSION: Probable mild atelectasis at the lung bases. Electronically Signed   By: Guadlupe Spanish M.D.   On: 08/13/2021 12:21    Procedures Procedures  Medications Ordered in the ED Medications  methylPREDNISolone sodium succinate (SOLU-MEDROL) 125 mg/2 mL injection 125 mg (125 mg Intravenous Given 08/13/21 1344)  ipratropium-albuterol (DUONEB) 0.5-2.5 (3) MG/3ML nebulizer solution 3 mL (3 mLs Nebulization Given 08/13/21 1403)     MDM Rules/Calculators/A&P MDM Patient here with SOB, wheezing, cough. Will give steroids, neb, check labs and CXR.   ED Course  I have reviewed the triage vital signs and the nursing notes.  Pertinent labs & imaging results that were available during my care of the patient were reviewed by me and considered in my medical decision making (see chart for details).  Clinical Course as of 08/13/21 1539  Wed Aug 13, 2021  1307 CXR without infiltrates.  [CS]  1336 CBC is unremarkable.  [CS]  1359 BMP is unremarkable.  [CS]  1424 BNP only mildly elevated.  [CS]  1446 Patient is feeling better, will attempt ambulation with usual oxygen to see if she is able to return to her LTCF.  [CS]  1506 First trop only mildly elevated.  [CS]  1524 Per RN, patient became dyspneic and dizzy with walking a short distance and had to be returned to the room in a wheelchair. Will discuss admission with the Hospitalist.  [CS]  1538 Spoke with Dr. Wendall Stade, Hospitalist, who will evaluate for  admission.  [CS]    Clinical Course User Index [CS] Pollyann Savoy, MD    Final Clinical Impression(s) / ED Diagnoses Final diagnoses:  COPD exacerbation [facility])    Rx / DC Orders ED Discharge Orders     None        Pollyann Savoy, MD 08/13/21 1539

## 2021-08-13 NOTE — ED Notes (Signed)
RT notified to administer DUONEB

## 2021-08-14 DIAGNOSIS — J441 Chronic obstructive pulmonary disease with (acute) exacerbation: Secondary | ICD-10-CM | POA: Diagnosis not present

## 2021-08-14 MED ORDER — PREDNISONE 10 MG PO TABS: 40.0000 mg | ORAL_TABLET | Freq: Every day | ORAL | 0 refills | Status: AC

## 2021-08-14 MED ORDER — BUDESONIDE 0.25 MG/2ML IN SUSP
0.2500 mg | Freq: Two times a day (BID) | RESPIRATORY_TRACT | Status: DC
  Administered 2021-08-14: 08:00:00 0.25 mg via RESPIRATORY_TRACT
  Filled 2021-08-14: qty 2

## 2021-08-14 MED ORDER — METHYLPREDNISOLONE SODIUM SUCC 125 MG IJ SOLR
60.0000 mg | Freq: Two times a day (BID) | INTRAMUSCULAR | Status: DC
  Administered 2021-08-14: 09:00:00 60 mg via INTRAVENOUS
  Filled 2021-08-14: qty 2

## 2021-08-14 MED ORDER — AZITHROMYCIN 500 MG PO TABS
500.0000 mg | ORAL_TABLET | Freq: Every day | ORAL | 0 refills | Status: AC
Start: 2021-08-14 — End: 2021-08-18

## 2021-08-14 NOTE — Progress Notes (Signed)
Discharge instructions given. No concerns voiced. Awaiting ride.

## 2021-08-14 NOTE — Progress Notes (Signed)
Had to go in and explain some things to patient about her CPAP.  Patient got upset because she though I worked for [employer] and her stuff was not delivered to help her with her CPAP.  Patient stated her CPAP has been broke for three weeks and her daughter-in-law has tried to get someone to come out and fix it and bring her the supplies she needs.

## 2021-08-14 NOTE — TOC Transition Note (Signed)
Transition of Care [facility]) - CM/SW Discharge Note   Patient Details  Name: Jody Elliott MRN: [id number] Date of Birth: [birth year]  [facility] CM/SW Contact:  Iona Beard, [place] Phone Number: 08/14/2021, 12:03 PM   Clinical Narrative:    TOC updated that pt is in need of Cpap adaptor. CSW met with pt in room to speak about needed DME. Pt explained that her machine is a [employer] and she uses [employer]. CSW spoke with [employer] rep and they will be sending more adaptors to pt via [employer] as they cannot deliver this to the hospital. CSW spoke to Ginger at [facility] who states that pt can return to the facility and they will send transportation for pt. CSW updated MD and RN of this. CSW called pts niece Ms. Moss to update that pt will be discharging back to the [facility] today. TOC singing off.   Final next level of care: Assisted Living Barriers to Discharge: Barriers Resolved   Patient Goals and CMS Choice Patient states their goals for this hospitalization and ongoing recovery are:: [facility] to [facility].gov Compare Post Acute Care list provided to:: Patient Choice offered to / list presented to : Patient  Discharge Placement                Patient to be transferred to facility by: Facility staff Name of family member notified: Foster Simpson (niece) Patient and family notified of of transfer: 08/14/21  Discharge Plan and Services                                     Social Determinants of Health (SDOH) Interventions     Readmission Risk Interventions No flowsheet data found.

## 2021-08-14 NOTE — Care Management Obs Status (Signed)
MEDICARE OBSERVATION STATUS NOTIFICATION   Patient Details  Name: Jody Elliott MRN: [id number] Date of Birth: 14-Jun-1941   Medicare Observation Status Notification Given:  Yes    Villa Herb, LCSWA 08/14/2021, 12:09 PM

## 2021-08-14 NOTE — Discharge Summary (Signed)
Physician Discharge Summary  Jody Elliott XTK:[id number] DOB: [birth year] DOA: 08/13/2021  PCP: Wilkie Aye, NP  Admit date: 08/13/2021  Discharge date: 08/14/2021  Admitted From:[facility]  Disposition:  [facility]  Recommendations for Outpatient Follow-up:  Follow up with PCP in 1-2 weeks Continue on prednisone as prescribed along with azithromycin Continue other home medications as prior Continue home CPAP with equipment issue is addressed by [facility]  Home Health: None  Equipment/Devices: Has home CPAP and 4 L nasal cannula oxygen  Discharge Condition:Stable  CODE STATUS: DNR  Diet recommendation: Heart Healthy  Brief/Interim Summary: Jody Elliott is a 80 y.o. female with medical history significant for COPD with chronic respiratory failure on 4 L, OSA on CPAP, HTN. Patient presented to the ED with complaints of difficulty breathing of 2 days duration, worsening cough of 2 days duration productive of greenish sputum.  Patient was admitted with acute COPD exacerbation, however her symptoms have improved drastically over the course of the evening with high-dose IV steroids as well as breathing treatments.  She is now back to her usual baseline and wears 4 L nasal cannula oxygen chronically.  She states that she is upset and concerned about her home CPAP machine.  She has been dealing with her equipment company and trying to receive a connector, but she has not gotten those delivered.  She states that as a result, she has not been using her home CPAP and feels that this may be part of the problem.  This will be addressed by TOC prior to discharge.  No other acute events or concerns noted throughout the course of this admission.  Discharge Diagnoses:  Principal Problem:   COPD with acute exacerbation (HCC) Active Problems:   OSA on CPAP   Chronic respiratory failure with hypoxia (HCC)  Principal discharge diagnosis: Acute COPD exacerbation.  OSA with CPAP  noncompliance due to equipment issues.  Discharge Instructions  Discharge Instructions     Diet - low sodium heart healthy   Complete by: As directed    Increase activity slowly   Complete by: As directed       Allergies as of 08/14/2021   No Known Allergies      Medication List     TAKE these medications    acetaminophen 325 MG tablet Commonly known as: TYLENOL Take 650 mg by mouth in the morning, at noon, and at bedtime.   aspirin EC 81 MG tablet Take 81 mg by mouth daily. Swallow whole.   azithromycin 500 MG tablet Commonly known as: ZITHROMAX Take 1 tablet (500 mg total) by mouth daily for 4 days.   famotidine 40 MG tablet Commonly known as: PEPCID Take 40 mg by mouth daily.   ferrous sulfate 325 (65 FE) MG tablet Take 325 mg by mouth daily with breakfast.   furosemide 20 MG tablet Commonly known as: LASIX Take 20 mg by mouth daily.   guaifenesin 100 MG/5ML syrup Commonly known as: ROBITUSSIN Take 200 mg by mouth every 6 (six) hours as needed for cough.   latanoprost 0.005 % ophthalmic solution Commonly known as: XALATAN Place 1 drop into both eyes at bedtime.   levalbuterol 1.25 MG/0.5ML nebulizer solution Commonly known as: XOPENEX Take 1.25 mg by nebulization every 4 (four) hours as needed for wheezing or shortness of breath.   Lidocaine HCl 4 % Crea Apply 1 application topically in the morning, at noon, and at bedtime.   lisinopril 10 MG tablet Commonly known as: ZESTRIL Take 10 mg  by mouth daily.   loperamide 2 MG capsule Commonly known as: IMODIUM Take 2 mg by mouth as needed for diarrhea or loose stools.   OXYGEN Inhale 4 L into the lungs daily.   pravastatin 20 MG tablet Commonly known as: PRAVACHOL Take 20 mg by mouth daily.   predniSONE 10 MG tablet Commonly known as: DELTASONE Take 4 tablets (40 mg total) by mouth daily for 5 days.   Stiolto Respimat 2.5-2.5 MCG/ACT Aers Generic drug: Tiotropium Bromide-Olodaterol Inhale  2 puffs into the lungs daily.   traMADol 50 MG tablet Commonly known as: ULTRAM Take 25 mg by mouth at bedtime.   Ventolin HFA 108 (90 Base) MCG/ACT inhaler Generic drug: albuterol Inhale 2 puffs into the lungs every 4 (four) hours as needed for wheezing.   vitamin B-12 500 MCG tablet Commonly known as: CYANOCOBALAMIN Take 1,000 mcg by mouth daily.   Vitamin D3 10 MCG (400 UNIT) Caps Take by mouth.        Follow-up Information     Wilkie Aye, NP. Schedule an appointment as soon as possible for a visit in 1 week(s).   Specialty: Adult Health Nurse Practitioner               No Known Allergies  Consultations: None   Procedures/Studies: DG Chest Portable 1 View  Result Date: 08/13/2021 CLINICAL DATA:  Shortness of breath EXAM: PORTABLE CHEST 1 VIEW COMPARISON:  07/26/2021 FINDINGS: The heart size and mediastinal contours are within normal limits. Background COPD. Patchy density at the lung bases probably reflects atelectasis. No pleural effusion. The visualized skeletal structures are unremarkable. IMPRESSION: Probable mild atelectasis at the lung bases. Electronically Signed   By: Guadlupe Spanish M.D.   On: 08/13/2021 12:21     Discharge Exam: Vitals:   08/14/21 0825 08/14/21 0827  BP:    Pulse:    Resp:    Temp:    SpO2: 99% 100%   Vitals:   08/13/21 2249 08/14/21 0549 08/14/21 0825 08/14/21 0827  BP:  (!) 122/54    Pulse: 97 (!) 104    Resp: 18 18    Temp:  (!) 97.3 F (36.3 C)    TempSrc:      SpO2: 98% 92% 99% 100%  Weight:      Height:        General: Pt is alert, awake, not in acute distress, obese Cardiovascular: RRR, S1/S2 +, no rubs, no gallops Respiratory: CTA bilaterally, no wheezing, no rhonchi, on 4 L nasal cannula Abdominal: Soft, NT, ND, bowel sounds + Extremities: no edema, no cyanosis    The results of significant diagnostics from this hospitalization (including imaging, microbiology, ancillary and laboratory) are  listed below for reference.     Microbiology: Recent Results (from the past 240 hour(s))  Resp Panel by RT-PCR (Flu A&B, Covid) Nasopharyngeal Swab     Status: None   Collection Time: 08/13/21  4:07 PM   Specimen: Nasopharyngeal Swab; Nasopharyngeal(NP) swabs in vial transport medium  Result Value Ref Range Status   SARS Coronavirus 2 by RT PCR NEGATIVE NEGATIVE Final    Comment: (NOTE) SARS-CoV-2 target nucleic acids are NOT DETECTED.  The SARS-CoV-2 RNA is generally detectable in upper respiratory specimens during the acute phase of infection. The lowest concentration of SARS-CoV-2 viral copies this assay can detect is 138 copies/mL. A negative result does not preclude SARS-Cov-2 infection and should not be used as the sole basis for treatment or other patient management decisions. A  negative result may occur with  improper specimen collection/handling, submission of specimen other than nasopharyngeal swab, presence of viral mutation(s) within the areas targeted by this assay, and inadequate number of viral copies(<138 copies/mL). A negative result must be combined with clinical observations, patient history, and epidemiological information. The expected result is Negative.  Fact Sheet for Patients:  BloggerCourse.com  Fact Sheet for Healthcare Providers:  SeriousBroker.it  This test is no t yet approved or cleared by the [place] FDA and  has been authorized for detection and/or diagnosis of SARS-CoV-2 by FDA under an Emergency Use Authorization (EUA). This EUA will remain  in effect (meaning this test can be used) for the duration of the COVID-19 declaration under Section 564(b)(1) of the Act, 21 U.S.C.section 360bbb-3(b)(1), unless the authorization is terminated  or revoked sooner.       Influenza A by PCR NEGATIVE NEGATIVE Final   Influenza B by PCR NEGATIVE NEGATIVE Final    Comment: (NOTE) The Xpert Xpress  SARS-CoV-2/FLU/RSV plus assay is intended as an aid in the diagnosis of influenza from Nasopharyngeal swab specimens and should not be used as a sole basis for treatment. Nasal washings and aspirates are unacceptable for Xpert Xpress SARS-CoV-2/FLU/RSV testing.  Fact Sheet for Patients: BloggerCourse.com  Fact Sheet for Healthcare Providers: SeriousBroker.it  This test is not yet approved or cleared by the [place] FDA and has been authorized for detection and/or diagnosis of SARS-CoV-2 by FDA under an Emergency Use Authorization (EUA). This EUA will remain in effect (meaning this test can be used) for the duration of the COVID-19 declaration under Section 564(b)(1) of the Act, 21 U.S.C. section 360bbb-3(b)(1), unless the authorization is terminated or revoked.  Performed at [facility], [address] [place], [place] [address]      Labs: BNP (last 3 results) Recent Labs    08/13/21 1255  BNP 573.0*   Basic Metabolic Panel: Recent Labs  Lab 08/13/21 1231  NA 139  K 4.5  CL 107  CO2 22  GLUCOSE 137*  BUN 37*  CREATININE 1.02*  CALCIUM 8.5*   Liver Function Tests: No results for input(s): AST, ALT, ALKPHOS, BILITOT, PROT, ALBUMIN in the last 168 hours. No results for input(s): LIPASE, AMYLASE in the last 168 hours. No results for input(s): AMMONIA in the last 168 hours. CBC: Recent Labs  Lab 08/13/21 1231  WBC 6.1  HGB 10.9*  HCT 34.4*  MCV 88.0  PLT 199   Cardiac Enzymes: No results for input(s): CKTOTAL, CKMB, CKMBINDEX, TROPONINI in the last 168 hours. BNP: Invalid input(s): POCBNP CBG: No results for input(s): GLUCAP in the last 168 hours. D-Dimer No results for input(s): DDIMER in the last 72 hours. Hgb A1c No results for input(s): HGBA1C in the last 72 hours. Lipid Profile No results for input(s): CHOL, HDL, LDLCALC, TRIG, CHOLHDL, LDLDIRECT in the last 72 hours. Thyroid function  studies No results for input(s): TSH, T4TOTAL, T3FREE, THYROIDAB in the last 72 hours.  Invalid input(s): FREET3 Anemia work up No results for input(s): VITAMINB12, FOLATE, FERRITIN, TIBC, IRON, RETICCTPCT in the last 72 hours. Urinalysis No results found for: COLORURINE, APPEARANCEUR, LABSPEC, PHURINE, GLUCOSEU, HGBUR, BILIRUBINUR, KETONESUR, PROTEINUR, UROBILINOGEN, NITRITE, LEUKOCYTESUR Sepsis Labs Invalid input(s): PROCALCITONIN,  WBC,  LACTICIDVEN Microbiology Recent Results (from the past 240 hour(s))  Resp Panel by RT-PCR (Flu A&B, Covid) Nasopharyngeal Swab     Status: None   Collection Time: 08/13/21  4:07 PM   Specimen: Nasopharyngeal Swab; Nasopharyngeal(NP) swabs in vial transport  medium  Result Value Ref Range Status   SARS Coronavirus 2 by RT PCR NEGATIVE NEGATIVE Final    Comment: (NOTE) SARS-CoV-2 target nucleic acids are NOT DETECTED.  The SARS-CoV-2 RNA is generally detectable in upper respiratory specimens during the acute phase of infection. The lowest concentration of SARS-CoV-2 viral copies this assay can detect is 138 copies/mL. A negative result does not preclude SARS-Cov-2 infection and should not be used as the sole basis for treatment or other patient management decisions. A negative result may occur with  improper specimen collection/handling, submission of specimen other than nasopharyngeal swab, presence of viral mutation(s) within the areas targeted by this assay, and inadequate number of viral copies(<138 copies/mL). A negative result must be combined with clinical observations, patient history, and epidemiological information. The expected result is Negative.  Fact Sheet for Patients:  BloggerCourse.com  Fact Sheet for Healthcare Providers:  SeriousBroker.it  This test is no t yet approved or cleared by the [place] FDA and  has been authorized for detection and/or diagnosis of SARS-CoV-2  by FDA under an Emergency Use Authorization (EUA). This EUA will remain  in effect (meaning this test can be used) for the duration of the COVID-19 declaration under Section 564(b)(1) of the Act, 21 U.S.C.section 360bbb-3(b)(1), unless the authorization is terminated  or revoked sooner.       Influenza A by PCR NEGATIVE NEGATIVE Final   Influenza B by PCR NEGATIVE NEGATIVE Final    Comment: (NOTE) The Xpert Xpress SARS-CoV-2/FLU/RSV plus assay is intended as an aid in the diagnosis of influenza from Nasopharyngeal swab specimens and should not be used as a sole basis for treatment. Nasal washings and aspirates are unacceptable for Xpert Xpress SARS-CoV-2/FLU/RSV testing.  Fact Sheet for Patients: BloggerCourse.com  Fact Sheet for Healthcare Providers: SeriousBroker.it  This test is not yet approved or cleared by the [place] FDA and has been authorized for detection and/or diagnosis of SARS-CoV-2 by FDA under an Emergency Use Authorization (EUA). This EUA will remain in effect (meaning this test can be used) for the duration of the COVID-19 declaration under Section 564(b)(1) of the Act, 21 U.S.C. section 360bbb-3(b)(1), unless the authorization is terminated or revoked.  Performed at [facility], [address] [place], [place] [address]      Time coordinating discharge: 35 minutes  SIGNED:   Erick Blinks, DO [facility] 08/14/2021, 11:01 AM  If 7PM-7AM, please contact night-coverage [email or web address]

## 2021-08-14 NOTE — Progress Notes (Signed)
Pt discharged to Assisted Living facility. Left unit stable and in wheelchair. No concerns voiced.

## 2022-01-13 ENCOUNTER — Emergency Department (HOSPITAL_COMMUNITY): Payer: Medicare Other

## 2022-01-13 ENCOUNTER — Encounter (HOSPITAL_COMMUNITY): Payer: Self-pay

## 2022-01-13 ENCOUNTER — Other Ambulatory Visit: Payer: Self-pay

## 2022-01-13 ENCOUNTER — Emergency Department (HOSPITAL_COMMUNITY)
Admission: EM | Admit: 2022-01-13 | Discharge: 2022-01-13 | Disposition: A | Discharge status: Home / Self Care | Payer: Medicare Other | Attending: Emergency Medicine | Admitting: Emergency Medicine

## 2022-01-13 DIAGNOSIS — S20219A Contusion of unspecified front wall of thorax, initial encounter: Secondary | ICD-10-CM | POA: Diagnosis not present

## 2022-01-13 DIAGNOSIS — W19XXXA Unspecified fall, initial encounter: Secondary | ICD-10-CM

## 2022-01-13 DIAGNOSIS — S0093XA Contusion of unspecified part of head, initial encounter: Secondary | ICD-10-CM | POA: Diagnosis not present

## 2022-01-13 DIAGNOSIS — W01198A Fall on same level from slipping, tripping and stumbling with subsequent striking against other object, initial encounter: Secondary | ICD-10-CM | POA: Diagnosis not present

## 2022-01-13 DIAGNOSIS — Z7982 Long term (current) use of aspirin: Secondary | ICD-10-CM | POA: Diagnosis not present

## 2022-01-13 DIAGNOSIS — S0990XA Unspecified injury of head, initial encounter: Secondary | ICD-10-CM | POA: Diagnosis present

## 2022-01-13 NOTE — ED Triage Notes (Signed)
Patient arrived via EMS from [facility] with complaints of unwitnessed fall. Per EMS patient was attempting to use the restroom and fell on her face. Denies blood thinner or LOC.

## 2022-01-13 NOTE — ED Provider Notes (Signed)
[facility] EMERGENCY DEPARTMENT Provider Note   CSN: [id number] Arrival date & time: 01/13/22  [id number]     History  Chief Complaint  Patient presents with   Jody Elliott is a 81 y.o. female.  Patient reports that she slipped on the dressings on her foot and fell hitting her head.  Complains of pain in her hand and some pain in the front of her chest she reports she did not blackout.  Patient denies any type of loss of consciousness she was not having any symptoms prior to fall no dizziness no weakness.  Patient complains that the dressings that she have on her legs are coming loose and cause her to trip.  Patient denies taking any blood thinners  The history is provided by the patient. No language interpreter was used.  Fall This is a new problem. The problem occurs constantly. The problem has not changed since onset.Associated symptoms include chest pain. Nothing aggravates the symptoms. Nothing relieves the symptoms. She has tried nothing for the symptoms. The treatment provided no relief.      Home Medications Prior to Admission medications   Medication Sig Start Date End Date Taking? Authorizing Provider  acetaminophen (TYLENOL) 325 MG tablet Take 650 mg by mouth in the morning, at noon, and at bedtime.    [provider]  aspirin EC 81 MG tablet Take 81 mg by mouth daily. Swallow whole.    [provider]  Cholecalciferol (VITAMIN D3) 10 MCG (400 UNIT) CAPS Take by mouth.    [provider]  famotidine (PEPCID) 40 MG tablet Take 40 mg by mouth daily. 02/10/21   [provider]  ferrous sulfate 325 (65 FE) MG tablet Take 325 mg by mouth daily with breakfast.    [provider]  furosemide (LASIX) 20 MG tablet Take 20 mg by mouth daily. 03/18/20   [provider]  guaifenesin (ROBITUSSIN) 100 MG/5ML syrup Take 200 mg by mouth every 6 (six) hours as needed for cough.    [provider]  latanoprost (XALATAN) 0.005  % ophthalmic solution Place 1 drop into both eyes at bedtime.    [provider]  levalbuterol (XOPENEX) 1.25 MG/0.5ML nebulizer solution Take 1.25 mg by nebulization every 4 (four) hours as needed for wheezing or shortness of breath.    [provider]  Lidocaine HCl 4 % CREA Apply 1 application topically in the morning, at noon, and at bedtime.    [provider]  lisinopril (ZESTRIL) 10 MG tablet Take 10 mg by mouth daily.    [provider]  loperamide (IMODIUM) 2 MG capsule Take 2 mg by mouth as needed for diarrhea or loose stools.    [provider]  OXYGEN Inhale 4 L into the lungs daily.    [provider]  pravastatin (PRAVACHOL) 20 MG tablet Take 20 mg by mouth daily. 03/11/20   [provider]  Tiotropium Bromide-Olodaterol (STIOLTO RESPIMAT) 2.5-2.5 MCG/ACT AERS Inhale 2 puffs into the lungs daily. 05/06/21   Martyn Ehrich, NP  traMADol (ULTRAM) 50 MG tablet Take 25 mg by mouth at bedtime.    [provider]  VENTOLIN HFA 108 (90 Base) MCG/ACT inhaler Inhale 2 puffs into the lungs every 4 (four) hours as needed for wheezing. 02/18/21   [provider]  vitamin B-12 (CYANOCOBALAMIN) 500 MCG tablet Take 1,000 mcg by mouth daily.    [provider]      Allergies  Patient has no known allergies.    Review of Systems   Review of Systems  Constitutional:  Negative for activity change and appetite change.  Respiratory:  Negative for chest tightness.   Cardiovascular:  Positive for chest pain.  Musculoskeletal:  Negative for back pain, gait problem and joint swelling.  Neurological:  Negative for dizziness, light-headedness and numbness.  All other systems reviewed and are negative.  Physical Exam Updated Vital Signs BP (!) 173/140 (BP Location: Left Arm)   Pulse 70   Temp 98.3 F (36.8 C) (Oral)   Resp 17   Ht 5\' 4"  (1.626 m)   Wt 96.2 kg   SpO2 100%   BMI 36.39 kg/m  Physical  Exam Vitals and nursing note reviewed.  Constitutional:      Appearance: Normal appearance. She is well-developed.  HENT:     Head: Normocephalic and atraumatic.     Comments: Patient points to forehead as area of pain there are no lacerations forehead is diffusely tender no bruising    Nose: Nose normal.     Mouth/Throat:     Mouth: Mucous membranes are moist.  Eyes:     Pupils: Pupils are equal, round, and reactive to light.  Cardiovascular:     Rate and Rhythm: Normal rate.     Pulses: Normal pulses.     Comments: Tender mid chest to palpation no bruising abdomen is soft and nontender with no bruising Pulmonary:     Effort: Pulmonary effort is normal.  Abdominal:     General: Abdomen is flat. There is no distension.  Musculoskeletal:        General: Normal range of motion.     Cervical back: Normal range of motion.     Comments: Dressing  bilateral lower legs from knee to toes  Neurological:     Mental Status: She is alert and oriented to person, place, and time.  Psychiatric:        Mood and Affect: Mood normal.    ED Results / Procedures / Treatments   Labs (all labs ordered are listed, but only abnormal results are displayed) Labs Reviewed - No data to display  EKG None  Radiology No results found.  Procedures Procedures    Medications Ordered in ED Medications - No data to display  ED Course/ Medical Decision Making/ A&P                           Medical Decision Making Patient reports she slipped and fell hit her head and her chest she did not lose consciousness she is not on any blood thinners  Amount and/or Complexity of Data Reviewed External Data Reviewed: notes.    Details: Hospitalist notes reviewed,  Pt has a history of copd Radiology: ordered and independent interpretation performed. Decision-making details documented in ED Course.    Details: CT head and x-ray of patient's chest no acute fracture no acute  abnormality           Final Clinical Impression(s) / ED Diagnoses Final diagnoses:  Fall, initial encounter  Contusion of head, unspecified part of head, initial encounter  Contusion of chest wall, unspecified laterality, initial encounter    Rx / DC Orders ED Discharge Orders     None      An After Visit Summary was printed and given to the patient.    Fransico Meadow, [place] 01/13/22 1813    Noemi Chapel, MD 01/19/22 [phone]

## 2022-01-13 NOTE — ED Notes (Signed)
Patient changed into clothes and grip socks provided.

## 2022-01-13 NOTE — Discharge Instructions (Signed)
Return if any problems.

## 2022-01-13 NOTE — ED Provider Notes (Incomplete)
[facility] EMERGENCY DEPARTMENT Provider Note   CSN: [id number] Arrival date & time: 01/13/22  [id number]     History {Add pertinent medical, surgical, social history, OB history to HPI:1} Chief Complaint  Patient presents with  . Fall    Jody Elliott is a 81 y.o. female.  Patient reports that she slipped on the dressings on her foot and fell hitting her head.  Complains of pain in her hand and some pain in the front of her chest she reports she did not blackout.  Patient denies any type of loss of consciousness she was not having any symptoms prior to fall no dizziness no weakness.  Patient complains that the dressings that she have on her legs are coming loose and cause her to trip.  Patient denies taking any blood thinners  The history is provided by the patient. No language interpreter was used.  Fall This is a new problem. The problem occurs constantly. The problem has not changed since onset.Associated symptoms include chest pain. Nothing aggravates the symptoms. Nothing relieves the symptoms. She has tried nothing for the symptoms. The treatment provided no relief.      Home Medications Prior to Admission medications   Medication Sig Start Date End Date Taking? Authorizing Provider  acetaminophen (TYLENOL) 325 MG tablet Take 650 mg by mouth in the morning, at noon, and at bedtime.    [provider]  aspirin EC 81 MG tablet Take 81 mg by mouth daily. Swallow whole.    [provider]  Cholecalciferol (VITAMIN D3) 10 MCG (400 UNIT) CAPS Take by mouth.    [provider]  famotidine (PEPCID) 40 MG tablet Take 40 mg by mouth daily. 02/10/21   [provider]  ferrous sulfate 325 (65 FE) MG tablet Take 325 mg by mouth daily with breakfast.    [provider]  furosemide (LASIX) 20 MG tablet Take 20 mg by mouth daily. 03/18/20   [provider]  guaifenesin (ROBITUSSIN) 100 MG/5ML syrup Take 200 mg by mouth every 6 (six) hours as  needed for cough.    [provider]  latanoprost (XALATAN) 0.005 % ophthalmic solution Place 1 drop into both eyes at bedtime.    [provider]  levalbuterol (XOPENEX) 1.25 MG/0.5ML nebulizer solution Take 1.25 mg by nebulization every 4 (four) hours as needed for wheezing or shortness of breath.    [provider]  Lidocaine HCl 4 % CREA Apply 1 application topically in the morning, at noon, and at bedtime.    [provider]  lisinopril (ZESTRIL) 10 MG tablet Take 10 mg by mouth daily.    [provider]  loperamide (IMODIUM) 2 MG capsule Take 2 mg by mouth as needed for diarrhea or loose stools.    [provider]  OXYGEN Inhale 4 L into the lungs daily.    [provider]  pravastatin (PRAVACHOL) 20 MG tablet Take 20 mg by mouth daily. 03/11/20   [provider]  Tiotropium Bromide-Olodaterol (STIOLTO RESPIMAT) 2.5-2.5 MCG/ACT AERS Inhale 2 puffs into the lungs daily. 05/06/21   Glenford Bayley, NP  traMADol (ULTRAM) 50 MG tablet Take 25 mg by mouth at bedtime.    [provider]  VENTOLIN HFA 108 (90 Base) MCG/ACT inhaler Inhale 2 puffs into the lungs every 4 (four) hours as needed for wheezing. 02/18/21   [provider]  vitamin B-12 (CYANOCOBALAMIN) 500 MCG tablet Take 1,000 mcg by mouth daily.    [provider]      Allergies    Patient has no known allergies.    Review of Systems   Review of Systems  Constitutional:  Negative for activity change and appetite change.  Respiratory:  Negative for chest tightness.   Cardiovascular:  Positive for chest pain.  Musculoskeletal:  Negative for back pain, gait problem and joint swelling.  Neurological:  Negative for dizziness, light-headedness and numbness.  All other systems reviewed and are negative.  Physical Exam Updated Vital Signs BP (!) 173/140 (BP Location: Left Arm)   Pulse 70   Temp 98.3 F (36.8 C) (Oral)   Resp 17   Ht 5'  4" (1.626 m)   Wt 96.2 kg   SpO2 100%   BMI 36.39 kg/m  Physical Exam Vitals and nursing note reviewed.  Constitutional:      Appearance: Normal appearance. She is well-developed.  HENT:     Head: Normocephalic and atraumatic.     Comments: Patient points to forehead as area of pain there are no lacerations forehead is diffusely tender no bruising    Nose: Nose normal.     Mouth/Throat:     Mouth: Mucous membranes are moist.  Eyes:     Pupils: Pupils are equal, round, and reactive to light.  Cardiovascular:     Rate and Rhythm: Normal rate.     Pulses: Normal pulses.     Comments: Tender mid chest to palpation no bruising abdomen is soft and nontender with no bruising Pulmonary:     Effort: Pulmonary effort is normal.  Abdominal:     General: Abdomen is flat. There is no distension.  Musculoskeletal:        General: Normal range of motion.     Cervical back: Normal range of motion.     Comments: Itched bilateral lower legs from knee to toes  Skin:    General: Skin is warm.  Neurological:     Mental Status: She is alert and oriented to person, place, and time.  Psychiatric:        Mood and Affect: Mood normal.    ED Results / Procedures / Treatments   Labs (all labs ordered are listed, but only abnormal results are displayed) Labs Reviewed - No data to display  EKG None  Radiology DG Chest 2 View  Result Date: 01/13/2022 CLINICAL DATA:  Fall EXAM: CHEST - 2 VIEW COMPARISON:  Radiograph August 13, 2021 FINDINGS: Lordotic positioning. The heart size and mediastinal contours are within normal limits. Reduced lung volumes. Hazy bibasilar opacities with interposed linear streaky opacities in the bilateral lung bases. Chronic bronchitic lung changes. No acute osseous abnormality. IMPRESSION: Hazy bibasilar opacities with interposed linear streaky opacities in the lung bases, likely reflect a combination of artifact from positioning and atelectasis however developing  infiltrate not excluded. Electronically Signed   By: Maudry MayhewJeffrey  Waltz M.D.   On: 01/13/2022 10:24   CT Head Wo Contrast  Result Date: 01/13/2022 CLINICAL DATA:  Trauma, fall EXAM: CT HEAD WITHOUT CONTRAST TECHNIQUE: Contiguous axial images were obtained from the base of the skull through the vertex without intravenous contrast. RADIATION DOSE REDUCTION: This exam was performed according to the departmental dose-optimization program which includes automated exposure control, adjustment of the mA and/or kV according to patient size and/or use of iterative reconstruction technique. COMPARISON:  None Available. FINDINGS: Brain: No acute intracranial findings are seen. There are no signs of bleeding within the cranium. Ventricles are not dilated. Cortical sulci are prominent. Vascular:  Unremarkable. Skull: No fracture is seen. Sinuses/Orbits: There are no air-fluid levels in the paranasal sinuses. Exophthalmos is seen on both sides. Other: None. IMPRESSION: No acute intracranial findings are seen in noncontrast CT brain. Atrophy. Electronically Signed   By: Ernie Avena M.D.   On: 01/13/2022 10:32    Procedures Procedures  {Document cardiac monitor, telemetry assessment procedure when appropriate:1}  Medications Ordered in ED Medications - No data to display  ED Course/ Medical Decision Making/ A&P                           Medical Decision Making Amount and/or Complexity of Data Reviewed Radiology: ordered.    Details: Ct head no acute abnormaltiy Ct chest atelectasis  Risk Risk Details: Pt does not have any respiratory complaints.  Fall sounds mechanical.     ***  {Document critical care time when appropriate:1} {Document review of labs and clinical decision tools ie heart score, Chads2Vasc2 etc:1}  {Document your independent review of radiology images, and any outside records:1} {Document your discussion with family members, caretakers, and with consultants:1} {Document social  determinants of health affecting pt's care:1} {Document your decision making why or why not admission, treatments were needed:1} Final Clinical Impression(s) / ED Diagnoses Final diagnoses:  None    Rx / DC Orders ED Discharge Orders     None

## 2022-01-13 NOTE — ED Notes (Signed)
Per Reg[facility] @ [facility] she will be here to pick patient up at 1340.

## 2022-02-23 ENCOUNTER — Emergency Department (HOSPITAL_COMMUNITY)
Admission: EM | Admit: 2022-02-23 | Discharge: 2022-02-23 | Disposition: A | Discharge status: Skilled Nursing Facility | Payer: Medicare Other | Attending: Emergency Medicine | Admitting: Emergency Medicine

## 2022-02-23 ENCOUNTER — Emergency Department (HOSPITAL_COMMUNITY): Payer: Medicare Other

## 2022-02-23 ENCOUNTER — Encounter (HOSPITAL_COMMUNITY): Payer: Self-pay

## 2022-02-23 ENCOUNTER — Other Ambulatory Visit: Payer: Self-pay

## 2022-02-23 DIAGNOSIS — R4182 Altered mental status, unspecified: Secondary | ICD-10-CM | POA: Diagnosis present

## 2022-02-23 DIAGNOSIS — Z7982 Long term (current) use of aspirin: Secondary | ICD-10-CM | POA: Diagnosis not present

## 2022-02-23 DIAGNOSIS — J449 Chronic obstructive pulmonary disease, unspecified: Secondary | ICD-10-CM | POA: Diagnosis not present

## 2022-02-23 DIAGNOSIS — G4730 Sleep apnea, unspecified: Secondary | ICD-10-CM | POA: Insufficient documentation

## 2022-02-23 DIAGNOSIS — F05 Delirium due to known physiological condition: Secondary | ICD-10-CM | POA: Diagnosis present

## 2022-02-23 DIAGNOSIS — N390 Urinary tract infection, site not specified: Secondary | ICD-10-CM | POA: Diagnosis not present

## 2022-02-23 DIAGNOSIS — Z733 Stress, not elsewhere classified: Secondary | ICD-10-CM | POA: Diagnosis not present

## 2022-02-23 DIAGNOSIS — I110 Hypertensive heart disease with heart failure: Secondary | ICD-10-CM | POA: Diagnosis not present

## 2022-02-23 DIAGNOSIS — I509 Heart failure, unspecified: Secondary | ICD-10-CM | POA: Insufficient documentation

## 2022-02-23 DIAGNOSIS — Z593 Problems related to living in residential institution: Secondary | ICD-10-CM | POA: Diagnosis not present

## 2022-02-23 DIAGNOSIS — Z008 Encounter for other general examination: Secondary | ICD-10-CM

## 2022-02-23 DIAGNOSIS — F22 Delusional disorders: Secondary | ICD-10-CM | POA: Diagnosis not present

## 2022-02-23 LAB — URINALYSIS, ROUTINE W REFLEX MICROSCOPIC
Bilirubin Urine: NEGATIVE
Glucose, UA: NEGATIVE mg/dL
Ketones, ur: NEGATIVE mg/dL
Nitrite: NEGATIVE
Protein, ur: 30 mg/dL — AB
RBC / HPF: >50 RBC/hpf — ABNORMAL HIGH (ref 0–5)
Specific Gravity, Urine: 1.021 (ref 1.005–1.030)
WBC, UA: >50 WBC/hpf — ABNORMAL HIGH (ref 0–5)
pH: 5 (ref 5.0–8.0)

## 2022-02-23 LAB — COMPREHENSIVE METABOLIC PANEL
ALT: 16 U/L (ref 0–44)
AST: 15 U/L (ref 15–41)
Albumin: 3.4 g/dL — ABNORMAL LOW (ref 3.5–5.0)
Alkaline Phosphatase: 74 U/L (ref 38–126)
Anion gap: 7 (ref 5–15)
BUN: 21 mg/dL (ref 8–23)
CO2: 34 mmol/L — ABNORMAL HIGH (ref 22–32)
Calcium: 9.1 mg/dL (ref 8.9–10.3)
Chloride: 98 mmol/L (ref 98–111)
Creatinine, Ser: 0.88 mg/dL (ref 0.44–1.00)
GFR, Estimated: >60 mL/min (ref >60)
Glucose, Bld: 110 mg/dL — ABNORMAL HIGH (ref 70–99)
Potassium: 4.7 mmol/L (ref 3.5–5.1)
Sodium: 139 mmol/L (ref 135–145)
Total Bilirubin: 0.4 mg/dL (ref 0.3–1.2)
Total Protein: 6.3 g/dL — ABNORMAL LOW (ref 6.5–8.1)

## 2022-02-23 LAB — CBC
HCT: 31.3 % — ABNORMAL LOW (ref 36.0–46.0)
Hemoglobin: 9.3 g/dL — ABNORMAL LOW (ref 12.0–15.0)
MCH: 29.6 pg (ref 26.0–34.0)
MCHC: 29.7 g/dL — ABNORMAL LOW (ref 30.0–36.0)
MCV: 99.7 fL (ref 80.0–100.0)
Platelets: 345 10*3/uL (ref 150–400)
RBC: 3.14 MIL/uL — ABNORMAL LOW (ref 3.87–5.11)
RDW: 12.7 % (ref 11.5–15.5)
WBC: 14.3 10*3/uL — ABNORMAL HIGH (ref 4.0–10.5)
nRBC: 0 % (ref 0.0–0.2)

## 2022-02-23 LAB — ETHANOL: Alcohol, Ethyl (B): <10 mg/dL (ref <10)

## 2022-02-23 MED ORDER — OLANZAPINE 2.5 MG PO TABS: 2.5000 mg | ORAL_TABLET | Freq: Every evening | ORAL | 0 refills | Status: DC | PRN

## 2022-02-23 MED ORDER — FOSFOMYCIN TROMETHAMINE 3 G PO PACK
3.0000 g | PACK | Freq: Once | ORAL | Status: AC
  Administered 2022-02-23: 3 g via ORAL
  Filled 2022-02-23: qty 3

## 2022-02-23 NOTE — Discharge Instructions (Addendum)
Stop the cipro and the macrobid.

## 2022-02-23 NOTE — Consult Note (Signed)
Telepsych Consultation   Reason for Consult:  Paranoia Referring Physician:  Gilda Crease, MD Location of Patient: [facility] Location of Provider: Other: [facility]  Patient Identification: Jody Elliott MRN:  [id number] Principal Diagnosis: Delirium due to another medical condition Diagnosis:  Principal Problem:   Delirium due to another medical condition Active Problems:   Acute paranoia (HCC)   UTI (urinary tract infection)   Total Time spent with patient: 30 minutes  Subjective:   Jody Elliott is a 81 y.o. female patient with medical history of COPD, CHF, hypertension, and sleep apnea admitted to [facility] after presenting for skilled nursing facility via EMS with complaints that patient has been paranoid thinking the staff at skilled nursing facility were trying to hurt her.  When asked patient would only say "it is not safe."    HPI:  Jody Elliott, 81 y.o., female patient seen via tele health by this provider, consulted with Dr. Earlene Plater; and chart reviewed on 02/23/22.  Chart review show no prior psychiatric history.    On evaluation Jody Elliott reports she feels good.  States she came to the hospital "because I was having problems at work and I came to the hospital."  When asked where she currently live she stated "I live in a nursing home."  Patient denies suicidal/self-harm/homicidal ideation, psychosis, and paranoia.  When asked about hallucinations and paranoia patient states "I'm not having hallucinations and it not paranoia."  Reporting that she feels the staff at the nursing facility is trying to hurt her.  Patient denies prior psychiatric history.   During evaluation Jody Elliott is sitting up in chair with no noted distress.  She is alert, oriented x 3.  She is able to give correct answers to current place, DOB, Age, and state.  Stated that she was unfamiliar with the city that [facility] was in because she was not familiar with area.  Stated that she was  not certain who current president is because she doesn't keep up with it.   Patient was calm and cooperative throughout assessment.  Her mood was euthymic but sightly annoyed with questions.  He mood is congruent affect. She is speaking in a clear tone at moderate volume, and normal pace; with good eye contact.  Her thought process is coherent and relevant.  there is no indication that she is currently responding to internal/external stimuli; However it does appears that patient may be experiencing delusional thought content as evident by her feeling paranoid that the staff and residents of the skill nursing facility are conspiring against her and staff trying to hurt her which is a recent change in patients behavior and thought process.  She denies suicidal/self-harm/homicidal ideation, psychosis, and paranoia.    Chart review that patients behavioral changes and paranoia began about 2-3 days ago.  Noted that patients baseline is often interacting with other residents and staff but for the last 2-3 days patient has been confused, making odd statements, paranoid that staff and residents are conspiring against her, and staff are trying to hurt her.       Patients urinalysis is abnormal and indicative of urinary tract infection (UTI).  The sudden onset of behavior changes, confusion, hallucination (paranoia) can occur in elderly individuals with UTI.  This is the most likely cause of confusion, behavioral changes and paranoia since this is typically not patients baseline and the changes has occurred in the last 2-3 days as noted by skilled nursing staff.  These symptoms usually  resolve with antibiotic treatment and at time short term use of an antipsychotic can be used to assist with delirium.     Past Psychiatric History: Denies prior psychiatric history.    Risk to Self:  Denies Risk to Others:  Denies Prior Inpatient Therapy:  Denies Prior Outpatient Therapy:  Denies  Past Medical History:  Past  Medical History:  Diagnosis Date   COPD (chronic obstructive pulmonary disease) (HCC)    Hyperlipidemia    Hypertension    Sleep apnea     Past Surgical History:  Procedure Laterality Date   APPENDECTOMY     DILATION AND CURETTAGE OF UTERUS     Family History:  Family History  Problem Relation Age of Onset   Diabetes Mother    Cancer Father    Diabetes Father    Family Psychiatric  History: None reported Social History:  Social History   Substance and Sexual Activity  Alcohol Use Not Currently     Social History   Substance and Sexual Activity  Drug Use Never    Social History   Socioeconomic History   Marital status: Single    Spouse name: Not on file   Number of children: Not on file   Years of education: Not on file   Highest education level: Not on file  Occupational History   Not on file  Tobacco Use   Smoking status: Former    Packs/day: 1.00    Years: 20.00    Total pack years: 20.00    Types: Cigarettes    Quit date: 2020    Years since quitting: 3.5   Smokeless tobacco: Never  Vaping Use   Vaping Use: Never used  Substance and Sexual Activity   Alcohol use: Not Currently   Drug use: Never   Sexual activity: Not on file  Other Topics Concern   Not on file  Social History Narrative   Not on file   Social Determinants of Health   Financial Resource Strain: Not on file  Food Insecurity: Not on file  Transportation Needs: Not on file  Physical Activity: Not on file  Stress: Not on file  Social Connections: Not on file   Additional Social History:    Allergies:  No Known Allergies  Labs:  Results for orders placed or performed during the hospital encounter of 02/23/22 (from the past 48 hour(s))  Urinalysis, Routine w reflex microscopic Urine, Clean Catch     Status: Abnormal   Collection Time: 02/23/22 12:15 AM  Result Value Ref Range   Color, Urine YELLOW YELLOW   APPearance CLOUDY (A) CLEAR   Specific Gravity, Urine 1.021 1.005 -  1.030   pH 5.0 5.0 - 8.0   Glucose, UA NEGATIVE NEGATIVE mg/dL   Hgb urine dipstick LARGE (A) NEGATIVE   Bilirubin Urine NEGATIVE NEGATIVE   Ketones, ur NEGATIVE NEGATIVE mg/dL   Protein, ur 30 (A) NEGATIVE mg/dL   Nitrite NEGATIVE NEGATIVE   Leukocytes,Ua MODERATE (A) NEGATIVE   RBC / HPF >50 (H) 0 - 5 RBC/hpf   WBC, UA >50 (H) 0 - 5 WBC/hpf   Bacteria, UA MANY (A) NONE SEEN   Squamous Epithelial / LPF 6-10 0 - 5   Mucus PRESENT    Hyaline Casts, UA PRESENT    Uric Acid Crys, UA PRESENT     Comment: Performed at [facility], [address], [place], [place] [address]  CBC     Status: Abnormal   Collection Time: 02/23/22 12:53 AM  Result Value Ref Range   WBC 14.3 (H) 4.0 - 10.5 K/uL   RBC 3.14 (L) 3.87 - 5.11 MIL/uL   Hemoglobin 9.3 (L) 12.0 - 15.0 g/dL   HCT 94.0 (L) 76.8 - 08.8 %   MCV 99.7 80.0 - 100.0 fL   MCH 29.6 26.0 - 34.0 pg   MCHC 29.7 (L) 30.0 - 36.0 g/dL   RDW 11.0 31.5 - 94.5 %   Platelets 345 150 - 400 K/uL   nRBC 0.0 0.0 - 0.2 %    Comment: Performed at [facility] [address], [place], [place] [address]  Comprehensive metabolic panel     Status: Abnormal   Collection Time: 02/23/22 12:53 AM  Result Value Ref Range   Sodium 139 135 - 145 mmol/L   Potassium 4.7 3.5 - 5.1 mmol/L   Chloride 98 98 - 111 mmol/L   CO2 34 (H) 22 - 32 mmol/L   Glucose, Bld 110 (H) 70 - 99 mg/dL    Comment: Glucose reference range applies only to samples taken after fasting for at least 8 hours.   BUN 21 8 - 23 mg/dL   Creatinine, Ser 2.44 0.44 - 1.00 mg/dL   Calcium 9.1 8.9 - 62.8 mg/dL   Total Protein 6.3 (L) 6.5 - 8.1 g/dL   Albumin 3.4 (L) 3.5 - 5.0 g/dL   AST 15 15 - 41 U/L   ALT 16 0 - 44 U/L   Alkaline Phosphatase 74 38 - 126 U/L   Total Bilirubin 0.4 0.3 - 1.2 mg/dL   GFR, Estimated >63 >81 mL/min    Comment: (NOTE) Calculated using the CKD-EPI Creatinine Equation (2021)    Anion gap 7 5 - 15    Comment: Performed at [facility], [address],  [place], [place] [address]  Ethanol     Status: None   Collection Time: 02/23/22 12:53 AM  Result Value Ref Range   Alcohol, Ethyl (B) <10 <10 mg/dL    Comment: (NOTE) Lowest detectable limit for serum alcohol is 10 mg/dL.  For medical purposes only. Performed at [facility], [address], [place], [place] [address]     Medications:  No current facility-administered medications for this encounter.   Current Outpatient Medications  Medication Sig Dispense Refill   acetaminophen (TYLENOL) 325 MG tablet Take 650 mg by mouth in the morning, at noon, and at bedtime.     aspirin EC 81 MG tablet Take 81 mg by mouth daily. Swallow whole.     Cholecalciferol (VITAMIN D3) 10 MCG (400 UNIT) CAPS Take by mouth.     famotidine (PEPCID) 40 MG tablet Take 40 mg by mouth daily.     ferrous sulfate 325 (65 FE) MG tablet Take 325 mg by mouth daily with breakfast.     furosemide (LASIX) 40 MG tablet Take 40 mg by mouth daily.     levalbuterol (XOPENEX) 1.25 MG/0.5ML nebulizer solution Take 1.25 mg by nebulization every 4 (four) hours as needed for wheezing or shortness of breath.     lisinopril (ZESTRIL) 10 MG tablet Take 10 mg by mouth daily.     loperamide (IMODIUM) 2 MG capsule Take 2 mg by mouth as needed for diarrhea or loose stools.     LUMIGAN 0.01 % SOLN Place 1 drop into both eyes at bedtime.     OXYGEN Inhale 3 L into the lungs daily.     potassium chloride SA (KLOR-CON M) 20 MEQ tablet Take 20 mEq by mouth  daily.     sertraline (ZOLOFT) 25 MG tablet Take 25 mg by mouth daily.     spironolactone (ALDACTONE) 25 MG tablet Take 12.5 mg by mouth daily.     Tiotropium Bromide-Olodaterol (STIOLTO RESPIMAT) 2.5-2.5 MCG/ACT AERS Inhale 2 puffs into the lungs daily. 4 g 11    Musculoskeletal: Strength & Muscle Tone:  Unable to assess via tele health Gait & Station:  Did not see patient ambulate Patient leans: N/A          Psychiatric Specialty Exam:  Presentation  General Appearance:  Appropriate for Environment Eye Contact:Good Speech:Clear and Coherent; Normal Rate Speech Volume:Normal Handedness:Right  Mood and Affect  Mood:Euthymic Affect:Appropriate; Congruent  Thought Process  Thought Processes:Coherent; Goal Directed Descriptions of Associations:Intact  Orientation:Full (Time, Place and Person)  Thought Content:Paranoid Ideation; Logical  History of Schizophrenia/Schizoaffective disorder:No  Duration of Psychotic Symptoms:N/A  Hallucinations:Hallucinations: None  Ideas of Reference:Paranoia  Suicidal Thoughts:Suicidal Thoughts: No  Homicidal Thoughts:Homicidal Thoughts: No   Sensorium  Memory:Immediate Fair; Recent Fair Judgment:Fair Insight:Present  Executive Functions  Concentration:Fair Attention Span:Fair Recall:Fair Fund of Knowledge:Good Language:Good  Psychomotor Activity  Psychomotor Activity:Psychomotor Activity: Normal  Assets  Assets:Communication Skills; Desire for Improvement; Financial Resources/Insurance; Housing; Leisure Time; Social Support  Sleep  Sleep:Sleep: Fair   Physical Exam: Physical Exam Vitals and nursing note reviewed. Exam conducted with a chaperone present.  Constitutional:      General: She is not in acute distress.    Appearance: Normal appearance. She is not ill-appearing.  Cardiovascular:     Rate and Rhythm: Normal rate.  Pulmonary:     Effort: Pulmonary effort is normal.  Neurological:     Mental Status: She is alert and oriented to person, place, and time.  Psychiatric:        Attention and Perception: Attention and perception normal. She does not perceive auditory or visual hallucinations.        Mood and Affect: Mood and affect normal.        Speech: Speech normal.        Behavior: Behavior normal. Behavior is cooperative.        Thought Content: Thought content is paranoid. Thought content does not include homicidal or suicidal ideation.    Review of Systems  Respiratory:   Negative for cough and wheezing.        History of COPD, oxygen via nasal canual   Cardiovascular: Negative.   Gastrointestinal: Negative.   Skin: Negative.   Neurological: Negative.   Psychiatric/Behavioral:  Negative for depression, hallucinations, substance abuse and suicidal ideas. Insomnia: States that she has problems staying asleep.    Blood pressure (!) 121/106, pulse 94, temperature 98.4 F (36.9 C), temperature source Oral, resp. rate 17, height 5\' 4"  (1.626 m), weight 96 kg, SpO2 99 %. Body mass index is 36.33 kg/m.  Treatment Plan Summary: Plan Psychiatrically clear  Recommendation:  Antibiotic treatment for UTI.  May start Zyprexa 2.5 mg daily for delirium (paranoia) related to UTI if needed.    Disposition: No evidence of imminent risk to self or others at present.   Patient does not meet criteria for psychiatric inpatient admission. Supportive therapy provided about ongoing stressors. Discussed crisis plan, support from social network, calling 911, coming to the Emergency Department, and calling Suicide Hotline.  This service was provided via telemedicine using a 2-way, interactive audio and video technology.  Names of all persons participating in this telemedicine service and their role in this encounter. Name: Role: NP  Name: Jody BrookeCarol Perezgarcia Role: Patient  Name:  Role:   Name:  Role:     Secure message sent to patient's nurse informing:  Psychiatric consult completed and patient has been psychiatrically cleared.  Chart review show no prior psychiatric history and acute onset of confusion and paranoia which is most likely related to UTI and should clear with antibiotic treatment.  A short-term use of antipsychotic medication may be helpful and recommend if needed can Zyprexa 2.5 mg daily for 3-5 days.  Please inform MD only default listed.   Robertta Halfhill, NP 02/23/2022 11:45 AM

## 2022-02-23 NOTE — ED Notes (Signed)
[facility] arrived to transport pt back to [facility].

## 2022-02-23 NOTE — ED Triage Notes (Signed)
[facility] from [facility]. Per EMS pt keeps calling daughter stating that they are trying to kill her and that daughter is tired of her calling so she wants to have her psychiatrically evaled

## 2022-02-23 NOTE — BH Assessment (Signed)
Comprehensive Clinical Assessment (CCA) Note  02/23/2022 Jody Elliott [id number]  Disposition: Per Roselyn Bering, NP overnight observation in the ED is recommended for further monitoring and medical clearance, UA still pending.  There are concerns patient's recent/sudden onset of s/s of paranoia may be associated with UTI.    The patient demonstrates the following risk factors for suicide: Chronic risk factors for suicide include: N/A. Acute risk factors for suicide include: loss (financial, interpersonal, professional). Protective factors for this patient include: positive social support, responsibility to others (children, family), coping skills, and life satisfaction. Considering these factors, the overall suicide risk at this point appears to be low. Patient is appropriate for outpatient follow up once medically cleared.   Patient is an 81 year old female with no known psychiatric history who presents voluntarily to [facility] via EMS from [facility] due to concerns she is experiencing worsening paranoia.   Per EDP note: "Jody Elliott is a 81 y.o. female who presents to the emergency department from skilled nursing facility.  She comes by EMS.  On arrival, she reports that she is not sure why she is here.  She does not have any complaints.  EMS reports that the patient has been paranoid, thinking that staff at the facility that she lives at were trying to hurt her.  When asked about this, she says she will not talk about it but will only say "it is not safe".  Upon assessment, patient shares she is doing "pretty good."  She shares various concerns about feeling mistreated by staff and residents at her facility.  She reports that an Charity fundraiser by the name of Leanne learned that she will report issues when they arise, and "now she doesn't like me."  Patient feels Lenda Kelp is conspiring with others to harm her or at least make her very uncomfortable.  She shares that she has not been provided with her O2 for  a week at one point.  She also shares that staff have changed her cord to  "the shorter cord to keep me from being able to go to the dining room."  She also believes they made her stop using her rolator and switch to a single wheel cane since they want to see her struggle. Patient also believes various residents had a "meeting to discuss how to handle me."  She states she overheard residents talking about this.  Patient shares about other concerns to include "bland food, residents being allowed to sleep in vacant rooms not being allowed to lock their doors."   The [employer] med tech, [place], who knows patient well, was interviewed.  She shares that patient's current symptoms began suddenly 2-3 days ago.  Staff have been concerned that "this is not Okey Regal at all."  She shares patient is often seen right outside of her room with other ladies, talking and laughing.  She reports Ivie has been getting confused about her O2 equipment.  Apparently, she has a Psychologist, clinical she uses when she moves around in the facility.  She will often change out hoses and forget which unit to use where.  Melissa also shares that there was a recent safety audit for the facility, during which they learned patient was stowing her 02 tank in the bottom of her rolator.  They recommended patient uses a specific metal cart for transfer.  This is the "one wheel unit patient refers to."  Staff have informed Melissa that patient has been making "odd statements" during the past few days.  She has mentioned seeing an RN drag a body down the hall.  She has also shared similar concerns about people conspiring against her.  Staff and patient's HCPOA have expressed concern patient may be experiencing paranoia related to a possible UTI.     Chief Complaint:  Chief Complaint  Patient presents with   Psychiatric Evaluation   Visit Diagnosis: Other persistent delusional disorder                             R/O Paranoia associated with possible  UTI   Flowsheet Row ED from 02/23/2022 in [facility] EMERGENCY DEPARTMENT  Thoughts that you would be better off dead, or of hurting yourself in some way Not at all  PHQ-9 Total Score 7      Flowsheet Row ED from 02/23/2022 in [facility] EMERGENCY DEPARTMENT ED from 01/13/2022 in [facility] EMERGENCY DEPARTMENT ED to Hosp-Admission (Discharged) from 08/13/2021 in [place] MEDICAL SURGICAL UNIT  C-SSRS RISK CATEGORY No Risk No Risk No Risk        CCA Screening, Triage and Referral (STR)  Patient Reported Information How did you hear about [place]? -- (EMS)  What Is the Reason for Your Visit/Call Today? Patient presented from [facility] via EMS due to concerns she has been calling her niece/HCPOA, to report staff are trying to kill her.  Patient is denying SI, HI, AVH or SA hx.  She expresses some concerns about some residents/staff who have had "meetings to decide what to do about me."  She also has various complaints about being taken off O2 for over a week at one point.  Again, she feels staff may be conspiring against her.  How Long Has This Been Causing You Problems? <Week  What Do You Feel Would Help You the Most Today? Treatment for Depression or other mood problem   Have You Recently Had Any Thoughts About Hurting Yourself? No  Are You Planning to Commit Suicide/Harm Yourself At This time? No   Have you Recently Had Thoughts About Hurting Someone Jody Elliott? No  Are You Planning to Harm Someone at This Time? No  Explanation: No data recorded  Have You Used Any Alcohol or Drugs in the Past 24 Hours? No  How Long Ago Did You Use Drugs or Alcohol? No data recorded What Did You Use and How Much? No data recorded  Do You Currently Have a Therapist/Psychiatrist? No  Name of Therapist/Psychiatrist: No data recorded  Have You Been Recently Discharged From Any Office Practice or Programs? No  Explanation of Discharge From Practice/Program: No data recorded    CCA Screening  Triage Referral Assessment Type of Contact: Tele-Assessment  Telemedicine Service Delivery: Telemedicine service delivery: This service was provided via telemedicine using a 2-way, interactive audio and video technology  Is this Initial or Reassessment? Initial Assessment  Date Telepsych consult ordered in [facility]:  02/23/22  Time Telepsych consult ordered in [facility]:  0139  Location of Assessment: [facility]  Provider Location: [facility]   Collateral Involvement: Collateral to be obtained once patient has medical clearance labs completed, as this would be needed prior to disposition/discharge planning.   Does Patient Have a Automotive engineer Guardian? No data recorded Name and Contact of Legal Guardian: No data recorded If Minor and Not Living with Parent(s), Who has Custody? No data recorded Is CPS involved or ever been involved? Never  Is APS involved or ever been involved? Never  Patient Determined To Be At Risk for Harm To Self or Others Based on Review of Patient Reported Information or Presenting Complaint? No  Method: No data recorded Availability of Means: No data recorded Intent: No data recorded Notification Required: No data recorded Additional Information for Danger to Others Potential: No data recorded Additional Comments for Danger to Others Potential: No data recorded Are There Guns or Other Weapons in Your Home? No data recorded Types of Guns/Weapons: No data recorded Are These Weapons Safely Secured?                            No data recorded Who Could Verify You Are Able To Have These Secured: No data recorded Do You Have any Outstanding Charges, Pending Court Dates, Parole/Probation? No data recorded Contacted To Inform of Risk of Harm To Self or Others: No data recorded   Does Patient Present under Involuntary Commitment? 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)   Patient Currently Receiving  the Following Services: Not Receiving Services   Determination of Need: Routine (7 days)   Options For Referral: Medication Management; Other: Comment (Medical clearance labs pending - concern for possible UTI)     CCA Biopsychosocial Patient Reported Schizophrenia/Schizoaffective Diagnosis in Past: No   Strengths: Patient has supportive staff at [facility], family is supportive   Mental Health Symptoms Depression:   Difficulty Concentrating   Duration of Depressive symptoms:  Duration of Depressive Symptoms: Less than two weeks   Mania:   None   Anxiety:    Tension; Worrying   Psychosis:   Delusions   Duration of Psychotic symptoms:  Duration of Psychotic Symptoms: Less than six months   Trauma:   None   Obsessions:   None   Compulsions:   None   Inattention:   N/A   Hyperactivity/Impulsivity:   N/A   Oppositional/Defiant Behaviors:   N/A   Emotional Irregularity:   None   Other Mood/Personality Symptoms:   Patient exhibits s/s of paranoia - sudden and recent onset 3 days ago    Mental Status Exam Appearance and self-care  Stature:   Average   Weight:   Average weight   Clothing:   Casual   Grooming:   Well-groomed   Cosmetic use:   Age appropriate   Posture/gait:   Normal   Motor activity:   Not Remarkable   Sensorium  Attention:   Inattentive; Confused   Concentration:   Variable   Orientation:   Person; Place   Recall/memory:   Defective in Short-term   Affect and Mood  Affect:   Congruent   Mood:   Anxious   Relating  Eye contact:   Normal   Facial expression:   Responsive   Attitude toward examiner:   Cooperative   Thought and Language  Speech flow:  Clear and Coherent   Thought content:   Delusions   Preoccupation:   None   Hallucinations:   None   Organization:  No data recorded  [employer] of Knowledge:   Average   Intelligence:   Average   Abstraction:    Concrete   Judgement:   Impaired   Reality Testing:   Distorted   Insight:   Gaps   Decision Making:   Confused; Vacilates   Social Functioning  Social Maturity:   Responsible   Social Judgement:   Normal  Stress  Stressors:   Relationship   Coping Ability:   Overwhelmed   Skill Deficits:   Communication; Decision making; Interpersonal   Supports:   Friends/Service system; Family     Religion: Religion/Spirituality Are You A Religious Person?: No  Leisure/Recreation: Leisure / Recreation Do You Have Hobbies?: Yes Leisure and Hobbies: talking with friends at ALF  Exercise/Diet: Exercise/Diet Do You Exercise?: No Have You Gained or Lost A Significant Amount of Weight in the Past Six Months?: No Do You Follow a Special Diet?: No Do You Have Any Trouble Sleeping?: No   CCA Employment/Education Employment/Work Situation: Employment / Work Situation Employment Situation: Unemployed Has Patient ever Been in Equities trader?: No  Education: Education Is Patient Currently Attending School?: No Last Grade Completed: 12 Did You Product manager?: No Did You Have An Individualized Education Program (IIEP): No Did You Have Any Difficulty At [employer]?: No Patient's Education Has Been Impacted by Current Illness: No   CCA Family/Childhood History Family and Relationship History: Family history Marital status: Single Does patient have children?: No (Niece is primary support, POA)  Childhood History:  Childhood History By whom was/is the patient raised?: Both parents Did patient suffer any verbal/emotional/physical/sexual abuse as a child?: No Did patient suffer from severe childhood neglect?: No Has patient ever been sexually abused/assaulted/raped as an adolescent or adult?: No Was the patient ever a victim of a crime or a disaster?: No Witnessed domestic violence?: No Has patient been affected by domestic violence as an adult?: No  Child/Adolescent  Assessment:     CCA Substance Use Alcohol/Drug Use: Alcohol / Drug Use Pain Medications: See MAR Prescriptions: See MAR Over the Counter: See MAR History of alcohol / drug use?: No history of alcohol / drug abuse                         ASAM's:  Six Dimensions of Multidimensional Assessment  Dimension 1:  Acute Intoxication and/or Withdrawal Potential:      Dimension 2:  Biomedical Conditions and Complications:      Dimension 3:  Emotional, Behavioral, or Cognitive Conditions and Complications:     Dimension 4:  Readiness to Change:     Dimension 5:  Relapse, Continued use, or Continued Problem Potential:     Dimension 6:  Recovery/Living Environment:     ASAM Severity Score:    ASAM Recommended Level of Treatment:     Substance use Disorder (SUD)    Recommendations for Services/Supports/Treatments:    Discharge Disposition:    DSM5 Diagnoses: Patient Active Problem List   Diagnosis Date Noted   COPD with acute exacerbation (HCC) 08/13/2021   OSA on CPAP 04/16/2020   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

## 2022-02-23 NOTE — ED Notes (Signed)
[facility] called and stated that pt could be transported back to facility. Transportation will be arranged. Report given to Marylene Land at [facility] as well as family. Prescription printed for facility.

## 2022-02-23 NOTE — ED Notes (Signed)
Jody Elliott was contacted and Jody Elliott (Resident [facility]) stated that she would need to be assessed by Jody Elliott before returning to facility to make sure she is appropriate. After assessment by Jody Elliott then transportation would need to be arranged by Jody Elliott to have patent taken back to facility. Jody Elliott stated that she would be here as soon as she could to perform assessment and would call advise when she is here.

## 2022-02-23 NOTE — ED Provider Notes (Addendum)
[facility] EMERGENCY DEPARTMENT Provider Note   CSN: [id number] Arrival date & time: 02/23/22  0000     History  Chief Complaint  Patient presents with   Psychiatric Evaluation    Jody Elliott is a 81 y.o. female.  Patient presents to the emergency department from skilled nursing facility.  She comes by EMS.  On arrival, she reports that she is not sure why she is here.  She does not have any complaints.  EMS reports that the patient has been paranoid, thinking that staff at the facility that she lives at were trying to hurt her.  When asked about this, she says she will not talk about it but will only say "it is not safe".       Home Medications Prior to Admission medications   Medication Sig Start Date End Date Taking? Authorizing Provider  acetaminophen (TYLENOL) 325 MG tablet Take 650 mg by mouth in the morning, at noon, and at bedtime.    [provider]  amoxicillin-clavulanate (AUGMENTIN) 875-125 MG tablet Take 1 tablet by mouth 2 (two) times daily. 01/07/22   [provider]  aspirin EC 81 MG tablet Take 81 mg by mouth daily. Swallow whole.    [provider]  Cholecalciferol (VITAMIN D3) 10 MCG (400 UNIT) CAPS Take by mouth.    [provider]  famotidine (PEPCID) 40 MG tablet Take 40 mg by mouth daily. 02/10/21   [provider]  ferrous sulfate 325 (65 FE) MG tablet Take 325 mg by mouth daily with breakfast.    [provider]  furosemide (LASIX) 40 MG tablet Take 40 mg by mouth daily. 03/18/20   [provider]  guaifenesin (ROBITUSSIN) 100 MG/5ML syrup Take 200 mg by mouth every 6 (six) hours as needed for cough.    [provider]  levalbuterol (XOPENEX) 1.25 MG/0.5ML nebulizer solution Take 1.25 mg by nebulization every 4 (four) hours as needed for wheezing or shortness of breath.    [provider]  lisinopril (ZESTRIL) 10 MG tablet Take 10 mg by mouth daily.    [provider]   loperamide (IMODIUM) 2 MG capsule Take 2 mg by mouth as needed for diarrhea or loose stools.    [provider]  LUMIGAN 0.01 % SOLN Place 1 drop into both eyes at bedtime. 12/23/21   [provider]  OXYGEN Inhale 3 L into the lungs daily.    [provider]  potassium chloride SA (KLOR-CON M) 20 MEQ tablet Take 20 mEq by mouth daily. 12/31/21   [provider]  predniSONE (DELTASONE) 5 MG tablet Take 5 mg by mouth daily. 12/18/21   [provider]  sertraline (ZOLOFT) 25 MG tablet Take 25 mg by mouth daily. 12/18/21   [provider]  spironolactone (ALDACTONE) 25 MG tablet Take 12.5 mg by mouth daily. 12/18/21   [provider]  Tiotropium Bromide-Olodaterol (STIOLTO RESPIMAT) 2.5-2.5 MCG/ACT AERS Inhale 2 puffs into the lungs daily. 05/06/21   Glenford Bayley, NP  VENTOLIN HFA 108 (90 Base) MCG/ACT inhaler Inhale 2 puffs into the lungs every 4 (four) hours as needed for wheezing. 02/18/21   [provider]      Allergies    Patient has no known allergies.    Review of Systems   Review of Systems  Physical Exam Updated Vital Signs BP 124/87   Pulse (!) 112   Temp 98.4 F (36.9 C) (Oral)   Resp 17   Ht  5\' 4"  (1.626 m)   Wt 96 kg   SpO2 100%   BMI 36.33 kg/m  Physical Exam Vitals and nursing note reviewed.  Constitutional:      General: She is not in acute distress.    Appearance: She is well-developed.  HENT:     Head: Normocephalic and atraumatic.     Mouth/Throat:     Mouth: Mucous membranes are moist.  Eyes:     General: Vision grossly intact. Gaze aligned appropriately.     Extraocular Movements: Extraocular movements intact.     Conjunctiva/sclera: Conjunctivae normal.  Cardiovascular:     Rate and Rhythm: Normal rate and regular rhythm.     Pulses: Normal pulses.     Heart sounds: Normal heart sounds, S1 normal and S2 normal. No murmur heard.    No friction rub. No gallop.  Pulmonary:     Effort:  Pulmonary effort is normal. No respiratory distress.     Breath sounds: Normal breath sounds.  Abdominal:     General: Bowel sounds are normal.     Palpations: Abdomen is soft.     Tenderness: There is no abdominal tenderness. There is no guarding or rebound.     Hernia: No hernia is present.  Musculoskeletal:        General: No swelling.     Cervical back: Full passive range of motion without pain, normal range of motion and neck supple. No spinous process tenderness or muscular tenderness. Normal range of motion.     Right lower leg: No edema.     Left lower leg: No edema.  Skin:    General: Skin is warm and dry.     Capillary Refill: Capillary refill takes less than 2 seconds.     Findings: No ecchymosis, erythema, rash or wound.  Neurological:     General: No focal deficit present.     Mental Status: She is alert and oriented to person, place, and time.     GCS: GCS eye subscore is 4. GCS verbal subscore is 5. GCS motor subscore is 6.     Cranial Nerves: Cranial nerves 2-12 are intact.     Sensory: Sensation is intact.     Motor: Motor function is intact.     Coordination: Coordination is intact.  Psychiatric:        Attention and Perception: Attention normal.        Mood and Affect: Mood normal.        Speech: Speech normal.        Behavior: Behavior normal.     ED Results / Procedures / Treatments   Labs (all labs ordered are listed, but only abnormal results are displayed) Labs Reviewed  CBC - Abnormal; Notable for the following components:      Result Value   WBC 14.3 (*)    RBC 3.14 (*)    Hemoglobin 9.3 (*)    HCT 31.3 (*)    MCHC 29.7 (*)    All other components within normal limits  COMPREHENSIVE METABOLIC PANEL - Abnormal; Notable for the following components:   CO2 34 (*)    Glucose, Bld 110 (*)    Total Protein 6.3 (*)    Albumin 3.4 (*)    All other components within normal limits  ETHANOL  URINALYSIS, ROUTINE W REFLEX MICROSCOPIC     EKG None  Radiology DG Chest Port 1 View  Result Date: 02/23/2022 CLINICAL DATA:  Shortness of breath EXAM: PORTABLE CHEST 1 VIEW  COMPARISON:  01/13/2022 FINDINGS: Cardiomegaly with mild central congestion. Streaky atelectasis or scarring at the bases. Aortic atherosclerosis. No pleural effusion or pneumothorax. IMPRESSION: 1. Cardiomegaly with slight central congestion 2. Streaky atelectasis or scarring at the bases Electronically Signed   By: Jasmine Pang M.D.   On: 02/23/2022 01:36    Procedures Procedures    Medications Ordered in ED Medications - No data to display  ED Course/ Medical Decision Making/ A&P                           Medical Decision Making Amount and/or Complexity of Data Reviewed Labs: ordered. Radiology: ordered.  Risk Prescription drug management.   Patient was sent to the emergency department from nursing home.  Patient has apparently been calling her daughter frequently and telling her daughter that the staff at the nursing home are trying to hurt her.  Daughter requested transport to the ED for psychiatric evaluation.  Here in the emergency department, patient reports that she has not been getting good care at the nursing home.  She does not seem significantly paranoid and does not feel like they are trying to harm her.  She reports having interpersonal clashes with some of the staff.  At this point it is unclear if she does have some psychiatric illness or if she is simply unhappy where she is living.  Will obtain TTS consultation to determine her psychiatric needs and if negative, may need social work consult to help with new placement.  Addendum: Urinalysis has finally been obtained.  Patient has had findings suggestive of UTI.  This may be lending to some of the behavioral disturbance.  Treat with fosfomycin.  Await repeat eval for disposition.      Final Clinical Impression(s) / ED Diagnoses Final diagnoses:  Encounter for psychological  evaluation    Rx / DC Orders ED Discharge Orders     None         Baylen Dea, Canary Brim, MD 02/23/22 0315    Gilda Crease, MD 02/23/22 [phone]

## 2022-02-23 NOTE — Progress Notes (Signed)
Per Assunta Found, NP pt has been psych cleared. TOC will assist with any discharge needs. This CSW will now remove from the [facility] shift report.  Maryjean Ka, MSW, [facility] 02/23/2022 4:29 PM

## 2022-02-23 NOTE — ED Notes (Signed)
Social work contacted for assistance with pt discharge.

## 2022-02-23 NOTE — ED Notes (Signed)
TTS in process   Pt sitting up in chair

## 2022-02-23 NOTE — ED Provider Notes (Signed)
Pt signed out by Dr. Blinda Leatherwood pending psych eval.  Pt was found to have a UTI and was treated with fosfomycin.  Pharmacy found that she was recently prescribed cipro and macrobid.  Macrobid could be the cause of psychosis in an elderly patient per pharm.  Pt has been seen by psych and was cleared by psych.  Psych did recommend prn zyprexa if needed.  Pt told to stop the cipro and the macrobid.   Jacalyn Lefevre, MD 02/23/22 [phone]

## 2022-02-23 NOTE — ED Notes (Signed)
Pt given meal tray.

## 2022-02-23 NOTE — ED Notes (Signed)
Pt sitting in chair at bedside. Watching tv in no apparent distress. Call bell in reach

## 2022-02-23 NOTE — ED Notes (Signed)
Lunch provided to pt

## 2022-02-26 ENCOUNTER — Emergency Department (HOSPITAL_COMMUNITY): Payer: Medicare Other

## 2022-02-26 ENCOUNTER — Other Ambulatory Visit: Payer: Self-pay

## 2022-02-26 ENCOUNTER — Inpatient Hospital Stay (HOSPITAL_COMMUNITY)
Admission: EM | Admit: 2022-02-26 | Discharge: 2022-02-28 | DRG: 191 | Disposition: A | Discharge status: Skilled Nursing Facility | Payer: Medicare Other | Source: Skilled Nursing Facility | Attending: Internal Medicine | Admitting: Internal Medicine

## 2022-02-26 ENCOUNTER — Encounter (HOSPITAL_COMMUNITY): Payer: Self-pay | Admitting: Emergency Medicine

## 2022-02-26 DIAGNOSIS — J441 Chronic obstructive pulmonary disease with (acute) exacerbation: Secondary | ICD-10-CM | POA: Diagnosis not present

## 2022-02-26 DIAGNOSIS — Z9981 Dependence on supplemental oxygen: Secondary | ICD-10-CM

## 2022-02-26 DIAGNOSIS — F22 Delusional disorders: Secondary | ICD-10-CM | POA: Diagnosis not present

## 2022-02-26 DIAGNOSIS — J9811 Atelectasis: Secondary | ICD-10-CM | POA: Diagnosis present

## 2022-02-26 DIAGNOSIS — Z7982 Long term (current) use of aspirin: Secondary | ICD-10-CM

## 2022-02-26 DIAGNOSIS — I10 Essential (primary) hypertension: Secondary | ICD-10-CM

## 2022-02-26 DIAGNOSIS — R600 Localized edema: Secondary | ICD-10-CM | POA: Diagnosis not present

## 2022-02-26 DIAGNOSIS — F0392 Unspecified dementia, unspecified severity, with psychotic disturbance: Secondary | ICD-10-CM | POA: Diagnosis not present

## 2022-02-26 DIAGNOSIS — R410 Disorientation, unspecified: Principal | ICD-10-CM

## 2022-02-26 DIAGNOSIS — E785 Hyperlipidemia, unspecified: Secondary | ICD-10-CM | POA: Diagnosis present

## 2022-02-26 DIAGNOSIS — Z79899 Other long term (current) drug therapy: Secondary | ICD-10-CM

## 2022-02-26 DIAGNOSIS — R262 Difficulty in walking, not elsewhere classified: Secondary | ICD-10-CM | POA: Diagnosis present

## 2022-02-26 DIAGNOSIS — J9611 Chronic respiratory failure with hypoxia: Secondary | ICD-10-CM

## 2022-02-26 DIAGNOSIS — G4733 Obstructive sleep apnea (adult) (pediatric): Secondary | ICD-10-CM

## 2022-02-26 DIAGNOSIS — Z87891 Personal history of nicotine dependence: Secondary | ICD-10-CM

## 2022-02-26 DIAGNOSIS — Z9989 Dependence on other enabling machines and devices: Secondary | ICD-10-CM

## 2022-02-26 DIAGNOSIS — F03918 Unspecified dementia, unspecified severity, with other behavioral disturbance: Secondary | ICD-10-CM | POA: Diagnosis not present

## 2022-02-26 DIAGNOSIS — Z833 Family history of diabetes mellitus: Secondary | ICD-10-CM

## 2022-02-26 LAB — URINALYSIS, ROUTINE W REFLEX MICROSCOPIC
Bilirubin Urine: NEGATIVE
Glucose, UA: NEGATIVE mg/dL
Ketones, ur: 5 mg/dL — AB
Leukocytes,Ua: NEGATIVE
Nitrite: NEGATIVE
Protein, ur: 30 mg/dL — AB
Specific Gravity, Urine: 1.02 (ref 1.005–1.030)
pH: 5 (ref 5.0–8.0)

## 2022-02-26 LAB — CBC WITH DIFFERENTIAL/PLATELET
Abs Immature Granulocytes: 0.09 10*3/uL — ABNORMAL HIGH (ref 0.00–0.07)
Basophils Absolute: 0 10*3/uL (ref 0.0–0.1)
Basophils Relative: 0 %
Eosinophils Absolute: 0 10*3/uL (ref 0.0–0.5)
Eosinophils Relative: 0 %
HCT: 34.1 % — ABNORMAL LOW (ref 36.0–46.0)
Hemoglobin: 10.1 g/dL — ABNORMAL LOW (ref 12.0–15.0)
Immature Granulocytes: 1 %
Lymphocytes Relative: 3 %
Lymphs Abs: 0.5 10*3/uL — ABNORMAL LOW (ref 0.7–4.0)
MCH: 29.9 pg (ref 26.0–34.0)
MCHC: 29.6 g/dL — ABNORMAL LOW (ref 30.0–36.0)
MCV: 100.9 fL — ABNORMAL HIGH (ref 80.0–100.0)
Monocytes Absolute: 0.3 10*3/uL (ref 0.1–1.0)
Monocytes Relative: 2 %
Neutro Abs: 15 10*3/uL — ABNORMAL HIGH (ref 1.7–7.7)
Neutrophils Relative %: 94 %
Platelets: 323 10*3/uL (ref 150–400)
RBC: 3.38 MIL/uL — ABNORMAL LOW (ref 3.87–5.11)
RDW: 12.7 % (ref 11.5–15.5)
WBC: 16 10*3/uL — ABNORMAL HIGH (ref 4.0–10.5)
nRBC: 0 % (ref 0.0–0.2)

## 2022-02-26 LAB — BASIC METABOLIC PANEL
Anion gap: 6 (ref 5–15)
BUN: 22 mg/dL (ref 8–23)
CO2: 34 mmol/L — ABNORMAL HIGH (ref 22–32)
Calcium: 9.4 mg/dL (ref 8.9–10.3)
Chloride: 100 mmol/L (ref 98–111)
Creatinine, Ser: 0.79 mg/dL (ref 0.44–1.00)
GFR, Estimated: >60 mL/min (ref >60)
Glucose, Bld: 121 mg/dL — ABNORMAL HIGH (ref 70–99)
Potassium: 4.7 mmol/L (ref 3.5–5.1)
Sodium: 140 mmol/L (ref 135–145)

## 2022-02-26 MED ORDER — PREDNISONE 20 MG PO TABS
40.0000 mg | ORAL_TABLET | Freq: Every day | ORAL | Status: DC
  Administered 2022-02-28: 40 mg via ORAL
  Filled 2022-02-26: qty 2

## 2022-02-26 MED ORDER — ACETAMINOPHEN 325 MG PO TABS: 650.0000 mg | ORAL_TABLET | Freq: Four times a day (QID) | ORAL | Status: DC | PRN

## 2022-02-26 MED ORDER — FUROSEMIDE 40 MG PO TABS
40.0000 mg | ORAL_TABLET | Freq: Every day | ORAL | Status: DC
  Administered 2022-02-27 – 2022-02-28 (×2): 40 mg via ORAL
  Filled 2022-02-26 (×2): qty 1

## 2022-02-26 MED ORDER — ASPIRIN 81 MG PO TBEC
81.0000 mg | DELAYED_RELEASE_TABLET | Freq: Every day | ORAL | Status: DC
  Administered 2022-02-27 – 2022-02-28 (×2): 81 mg via ORAL
  Filled 2022-02-26 (×2): qty 1

## 2022-02-26 MED ORDER — ONDANSETRON HCL 4 MG/2ML IJ SOLN: 4.0000 mg | Freq: Four times a day (QID) | INTRAMUSCULAR | Status: DC | PRN

## 2022-02-26 MED ORDER — LATANOPROST 0.005 % OP SOLN
1.0000 [drp] | Freq: Every day | OPHTHALMIC | Status: DC
  Administered 2022-02-26 – 2022-02-27 (×2): 1 [drp] via OPHTHALMIC
  Filled 2022-02-26: qty 2.5

## 2022-02-26 MED ORDER — ONDANSETRON HCL 4 MG PO TABS: 4.0000 mg | ORAL_TABLET | Freq: Four times a day (QID) | ORAL | Status: DC | PRN

## 2022-02-26 MED ORDER — IPRATROPIUM-ALBUTEROL 0.5-2.5 (3) MG/3ML IN SOLN: 3.0000 mL | RESPIRATORY_TRACT | Status: DC | PRN

## 2022-02-26 MED ORDER — ENOXAPARIN SODIUM 40 MG/0.4ML IJ SOSY
40.0000 mg | PREFILLED_SYRINGE | INTRAMUSCULAR | Status: DC
Start: 2022-02-27 — End: 2022-02-28
  Administered 2022-02-27 – 2022-02-28 (×2): 40 mg via SUBCUTANEOUS
  Filled 2022-02-26 (×2): qty 0.4

## 2022-02-26 MED ORDER — LISINOPRIL 10 MG PO TABS
10.0000 mg | ORAL_TABLET | Freq: Every day | ORAL | Status: DC
  Administered 2022-02-27 – 2022-02-28 (×2): 10 mg via ORAL
  Filled 2022-02-26 (×2): qty 1

## 2022-02-26 MED ORDER — FAMOTIDINE 20 MG PO TABS
40.0000 mg | ORAL_TABLET | Freq: Every day | ORAL | Status: DC
  Administered 2022-02-27 – 2022-02-28 (×2): 40 mg via ORAL
  Filled 2022-02-26 (×2): qty 2

## 2022-02-26 MED ORDER — IPRATROPIUM-ALBUTEROL 0.5-2.5 (3) MG/3ML IN SOLN
3.0000 mL | Freq: Three times a day (TID) | RESPIRATORY_TRACT | Status: DC
  Administered 2022-02-27: 3 mL via RESPIRATORY_TRACT
  Filled 2022-02-26: qty 3

## 2022-02-26 MED ORDER — POLYETHYLENE GLYCOL 3350 17 G PO PACK
17.0000 g | PACK | Freq: Every day | ORAL | Status: DC | PRN
Start: 2022-02-26 — End: 2022-02-28

## 2022-02-26 MED ORDER — ACETAMINOPHEN 650 MG RE SUPP: 650.0000 mg | Freq: Four times a day (QID) | RECTAL | Status: DC | PRN

## 2022-02-26 MED ORDER — METHYLPREDNISOLONE SODIUM SUCC 125 MG IJ SOLR
60.0000 mg | Freq: Two times a day (BID) | INTRAMUSCULAR | Status: AC
  Administered 2022-02-26 – 2022-02-27 (×2): 60 mg via INTRAVENOUS
  Filled 2022-02-26 (×2): qty 2

## 2022-02-26 MED ORDER — DOXYCYCLINE HYCLATE 100 MG PO TABS
100.0000 mg | ORAL_TABLET | Freq: Two times a day (BID) | ORAL | Status: DC
  Administered 2022-02-26 – 2022-02-28 (×4): 100 mg via ORAL
  Filled 2022-02-26 (×4): qty 1

## 2022-02-26 NOTE — H&P (Signed)
History and Physical    Jody Elliott IOE:[id number] DOB: Jun 04, 1941 DOA: 02/26/2022  PCP: Wilkie Aye, NP   Patient coming from: [facility]  I have personally briefly reviewed patient's old medical records in [facility] [place] [facility]  Chief Complaint: AMS  HPI: Jody Elliott is a 81 y.o. 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  Patient was brought to the ED from hospital has reports of change in mental status and difficulty breathing and unable to walk.  At time of my evaluation patient is awake alert oriented X 4 and able to answer most questions appropriately, daughter Misty Stanley and her husband Joe at bedside and assist with the history. Patient was brought to the ED because she kept saying "they are trying to kill me".  Daughter Misty Stanley reports she is referring to a particular staff at the nursing home, and she believes what patient is saying is because of some recent events (related to the staff- ?? a nurse practitioner, at the nursing home). Per daughter she reports the staff told them patient had swelling in her brain, and fluid around her heart from recent ED visit 7/10, she later confirmed later that this was not true.  Same staff also initially would not discuss results of recent ED visit 7/10 with patient's when family was present at bedside. Also other occurrences that family was concerned about related to patient's oxygen tank.  Daughter also reports same staff called EMS that patient was unable to walk had no difficulty breathing. At baseline patient has no significant memory deficits.  She is able to ambulate without assistance. EMS reports patient is able to walk to the stretcher.  Patient denies any difficulty breathing, she denies worsening cough.  No fevers no chills.  No vomiting no loose stools.  She denies urinary symptoms.  ED visit 7/10-with same reports that she feels the staff at the facility were trying  to hurt her.  And she reported she could not talk about it as 'it is not safe".  Thoughts the patient had a UTI, she was treated with fosfomycin.  Pharmacy called that patient had recently been on Cipro and Macrobid.,  And that Macrobid could cause psychosis in elderly.  Patient was cleared by psych.  I recommended Zyprexa as needed.  ED Course: Sats 95 to 100% on 2 L.  Respiratory 20-25.  Temperature 98.5.  Blood pressure systolic 140- 154. WBC 16. UA with many bacteria Hospitalist was called to admit for altered mental status.  Review of Systems: As per HPI all other systems reviewed and negative.  Past Medical History:  Diagnosis Date   COPD (chronic obstructive pulmonary disease) (HCC)    Hyperlipidemia    Hypertension    Sleep apnea     Past Surgical History:  Procedure Laterality Date   APPENDECTOMY     DILATION AND CURETTAGE OF UTERUS       reports that she quit smoking about 3 years ago. Her smoking use included cigarettes. She has a 20.00 pack-year smoking history. She has never used smokeless tobacco. She reports that she does not currently use alcohol. She reports that she does not use drugs.  No Known Allergies  Family History  Problem Relation Age of Onset   Diabetes Mother    Cancer Father    Diabetes Father     Prior to Admission medications   Medication Sig Start Date End Date Taking? Authorizing Provider  acetaminophen (TYLENOL) 325  MG tablet Take 650 mg by mouth in the morning, at noon, and at bedtime.   Yes [provider]  aspirin EC 81 MG tablet Take 81 mg by mouth daily. Swallow whole.   Yes [provider]  Cholecalciferol (VITAMIN D3) 10 MCG (400 UNIT) CAPS Take by mouth.   Yes [provider]  famotidine (PEPCID) 40 MG tablet Take 40 mg by mouth daily. 02/10/21  Yes [provider]  ferrous sulfate 325 (65 FE) MG tablet Take 325 mg by mouth daily with breakfast.   Yes [provider]  furosemide (LASIX) 40  MG tablet Take 40 mg by mouth daily. 03/18/20  Yes [provider]  levalbuterol (XOPENEX) 1.25 MG/0.5ML nebulizer solution Take 1.25 mg by nebulization every 4 (four) hours as needed for wheezing or shortness of breath. After lunch   Yes [provider]  lisinopril (ZESTRIL) 10 MG tablet Take 10 mg by mouth daily.   Yes [provider]  LUMIGAN 0.01 % SOLN Place 1 drop into both eyes at bedtime. 12/23/21  Yes [provider]  OXYGEN Inhale 3 L into the lungs daily.   Yes [provider]  potassium chloride SA (KLOR-CON M) 20 MEQ tablet Take 20 mEq by mouth daily. 12/31/21  Yes [provider]  sertraline (ZOLOFT) 25 MG tablet Take 25 mg by mouth daily. 12/18/21  Yes [provider]  spironolactone (ALDACTONE) 25 MG tablet Take 12.5 mg by mouth daily. 12/18/21  Yes [provider]  Tiotropium Bromide-Olodaterol (STIOLTO RESPIMAT) 2.5-2.5 MCG/ACT AERS Inhale 2 puffs into the lungs daily. 05/06/21  Yes Glenford Bayley, NP  loperamide (IMODIUM) 2 MG capsule Take 2 mg by mouth as needed for diarrhea or loose stools.    [provider]  OLANZapine (ZYPREXA) 2.5 MG tablet Take 1 tablet (2.5 mg total) by mouth at bedtime as needed (psychosis). 02/23/22   Jacalyn Lefevre, MD    Physical Exam: Vitals:   02/26/22 1337 02/26/22 1700 02/26/22 1730 02/26/22 1830  BP:  135/79 130/79 (!) 154/59  Pulse:  94 92 90  Resp:  18 (!) 24 20  Temp:      SpO2:  99% 99% 98%  Weight: 96.2 kg     Height: 5\' 4"  (1.626 m)       Constitutional: Increased work of breathing,  Vitals:   02/26/22 1337 02/26/22 1700 02/26/22 1730 02/26/22 1830  BP:  135/79 130/79 (!) 154/59  Pulse:  94 92 90  Resp:  18 (!) 24 20  Temp:      SpO2:  99% 99% 98%  Weight: 96.2 kg     Height: 5\' 4"  (1.626 m)      Eyes: PERRL, lids and conjunctivae normal ENMT: Mucous membranes are moist.  Neck: normal, supple, no masses, no thyromegaly Respiratory: Bilateral  expiratory wheezing, with reduced air entry, use of accessory abdominal muscles, tachypneic at the time of my evaluation up to 34, patient reports she is just back from [place] hence the increased effort to breath, .  Cardiovascular: Regular rate and rhythm, no murmurs / rubs / gallops.  Trace bilateral extremity edema chronic and unchanged  Lower extremities warm  abdomen: no tenderness, no masses palpated. No hepatosplenomegaly. Bowel sounds positive.  Musculoskeletal: no clubbing / cyanosis. No joint deformity upper and lower extremities.  Skin: no rashes, lesions, ulcers. No induration Neurologic: No apparent cranial nerve abnormality, 5/5 strength in all extremities.  Psychiatric: Normal judgment and insight. Alert and oriented x  3. Normal mood.   Labs on Admission: I have personally reviewed following labs and imaging studies  CBC: Recent Labs  Lab 02/23/22 0053 02/26/22 1601  WBC 14.3* 16.0*  NEUTROABS  --  15.0*  HGB 9.3* 10.1*  HCT 31.3* 34.1*  MCV 99.7 100.9*  PLT 345 323   Basic Metabolic Panel: Recent Labs  Lab 02/23/22 0053 02/26/22 1601  NA 139 140  K 4.7 4.7  CL 98 100  CO2 34* 34*  GLUCOSE 110* 121*  BUN 21 22  CREATININE 0.88 0.79  CALCIUM 9.1 9.4   GFR: Estimated Creatinine Clearance: 62.1 mL/min (by C-G formula based on SCr of 0.79 mg/dL). Liver Function Tests: Recent Labs  Lab 02/23/22 0053  AST 15  ALT 16  ALKPHOS 74  BILITOT 0.4  PROT 6.3*  ALBUMIN 3.4*   BNP (last 3 results) Recent Labs    03/25/21 1217  PROBNP 46.0   Urine analysis:    Component Value Date/Time   COLORURINE YELLOW 02/26/2022 1625   APPEARANCEUR HAZY (A) 02/26/2022 1625   LABSPEC 1.020 02/26/2022 1625   PHURINE 5.0 02/26/2022 1625   GLUCOSEU NEGATIVE 02/26/2022 1625   HGBUR MODERATE (A) 02/26/2022 1625   BILIRUBINUR NEGATIVE 02/26/2022 1625   KETONESUR 5 (A) 02/26/2022 1625   PROTEINUR 30 (A) 02/26/2022 1625   NITRITE NEGATIVE 02/26/2022 1625   LEUKOCYTESUR  NEGATIVE 02/26/2022 1625    Radiological Exams on Admission: DG Chest Portable 1 View  Result Date: 02/26/2022 CLINICAL DATA:  Shortness of breath, wheezing, COPD, hypertension, former smoker EXAM: PORTABLE CHEST 1 VIEW COMPARISON:  Portable exam 1508 hours compared to 02/23/2022 FINDINGS: Enlargement of cardiac silhouette. Mediastinal contours and pulmonary vascularity normal. Atherosclerotic calcification aorta. Bibasilar atelectasis Subsegmental atelectasis in RIGHT upper lobe, slightly more nodular than appearance than on the prior study. No definite infiltrate, pleural effusion or pneumothorax. Bones demineralized. IMPRESSION: Bibasilar atelectasis as well as subsegmental atelectasis in RIGHT upper lobe. Enlargement of cardiac silhouette. Aortic Atherosclerosis (ICD10-I70.0). Electronically Signed   By: Ulyses Southward M.D.   On: 02/26/2022 15:30    EKG: None.   Assessment/Plan Principal Problem:   COPD with acute exacerbation (HCC) Active Problems:   OSA on CPAP   Chronic respiratory failure with hypoxia (HCC)   Acute paranoia (HCC)   Bilateral lower extremity edema   HTN (hypertension)   Assessment and Plan: * COPD with acute exacerbation (HCC) Bilateral expiratory wheezing with mild to moderate increased work of breathing..  No increase in O2 requirements.  Chest x-ray suggest atelectasis.  -DuoNebs as needed and scheduled -IV azithromycin -IV Solu-Medrol 60 twice daily -As needed mucolytic's  Acute paranoia (HCC) Initial concern for altered mental status.  But this is likely secondary to recent events at the nursing home that has patient and family consent.  I have told patient's daughter Misty Stanley to reach out to [facility] to have her concerns addressed.  She is able to ambulate and has 5/5 strength in all extremities.  No significant memory problems. - Recent UA suggestive of UTI was treated with fosfomycin in ED, prior to that she had been on Cipro and Macrobid.  No  cultures. -Head CT-mild motion degradation no evidence of acute intracranial abnormality. -Leukocytosis of 16, UA today with many bacteria, but looks better than recent UA.  Will hold off on antibiotics to treat UTI at this time. -Leukocytosis may worsen due to steroids  HTN (hypertension) Stable. -Resume Lasix 40 mg daily, lisinopril 10 mg  Bilateral lower extremity edema  Chronic and unchanged.   - Resume Lasix 40 mg daily  Chronic respiratory failure with hypoxia (HCC) O2 sats 95 to 100% on home 2 L.   DVT prophylaxis: Lovenox Code Status: Full-confirmed with patient, daughter Misty Stanley, and her husband Francis Dowse at bedside. Family Communication: Daughter Gavin Pound most is HCPOA.  Not present today.  Daughter Misty Stanley and husband were at bedside. Disposition Plan:  ~ 1 - 2 days Consults called: None Admission status:  Obs med surg   Author: Onnie Boer, MD 02/26/2022 9:07 PM  For on call review [email or web address].

## 2022-02-26 NOTE — Assessment & Plan Note (Signed)
Chronic and unchanged.   - Resume Lasix 40 mg daily

## 2022-02-26 NOTE — ED Notes (Signed)
[facility] given update on pt status

## 2022-02-26 NOTE — Assessment & Plan Note (Signed)
Bilateral expiratory wheezing with mild to moderate increased work of breathing..  No increase in O2 requirements.  Chest x-ray suggest atelectasis.  -DuoNebs as needed and scheduled -IV azithromycin -IV Solu-Medrol 60 twice daily -As needed mucolytic's

## 2022-02-26 NOTE — ED Notes (Signed)
Patient transported to CT 

## 2022-02-26 NOTE — ED Triage Notes (Signed)
Pt to ER via EMS from [facility] with reports of SHOB.  Per EMS staff reports that pt has not been herself ans that she has not been walking. EMS states pt walked to their stretcher.  EMS report pt has audible wheezes and was given solu-medrol and a duoneb.  PT states she is feeling some better.  Pt wears 2L [place] at baseline.

## 2022-02-26 NOTE — Assessment & Plan Note (Addendum)
Initial concern for altered mental status.  But this is likely secondary to recent events at the nursing home that has patient and family consent.  I have told patient's daughter Misty Stanley to reach out to [facility] to have her concerns addressed.  She is able to ambulate and has 5/5 strength in all extremities.  No significant memory problems. - Recent UA suggestive of UTI was treated with fosfomycin in ED, prior to that she had been on Cipro and Macrobid.  No cultures. -Head CT-mild motion degradation no evidence of acute intracranial abnormality. -Leukocytosis of 16, UA today with many bacteria, but looks better than recent UA.  Will hold off on antibiotics to treat UTI at this time. -Leukocytosis may worsen due to steroids

## 2022-02-26 NOTE — ED Provider Notes (Signed)
[facility] Provider Note   CSN: [id number] Arrival date & time: 02/26/22  1310     History  Chief Complaint  Patient presents with   Shortness of Breath    Jody BrookeCarol Shamoon is a 81 y.o. female with history of OSA on CPAP, COPD with baseline 2 L nasal cannula at home, acute paranoia who presents to the ED via EMS from [facility].  When I asked patient why she is here, she states that she does not know.  She states that she was here last night and after she left, "they were trying to kill me and it did not work so they sent me back".  Per nursing notes, EMS states that they were called with reports that patient has not been herself and that she has not been walking as she usually does.  They visualized patient walking to their stretcher.  Per EMS, she had audible wheezing at the time and was given Solu-Medrol and a DuoNeb.  Currently, patient states that she does not have any shortness of breath worse than her baseline.  She denies cough, chest pain, abdominal pain, nausea, vomiting and diarrhea.  Per chart review, patient was brought here to the emergency department 3 days ago for paranoia.  She was cleared by psych at that time.  She also had evidence of a urinary tract infection and was sent home with fosfomycin.  Pharmacy had also notified them that she was recently prescribed Cipro and Macrobid and Macrobid has been known to cause psychosis in elderly patients.  History is somewhat limited given patient's mental status.   Shortness of Breath Associated symptoms: no abdominal pain, no chest pain, no cough and no vomiting        Home Medications Prior to Admission medications   Medication Sig Start Date End Date Taking? Authorizing Provider  acetaminophen (TYLENOL) 325 MG tablet Take 650 mg by mouth in the morning, at noon, and at bedtime.   Yes [provider]  aspirin EC 81 MG tablet Take 81 mg by mouth daily. Swallow whole.   Yes [provider]   Cholecalciferol (VITAMIN D3) 10 MCG (400 UNIT) CAPS Take by mouth.   Yes [provider]  famotidine (PEPCID) 40 MG tablet Take 40 mg by mouth daily. 02/10/21  Yes [provider]  ferrous sulfate 325 (65 FE) MG tablet Take 325 mg by mouth daily with breakfast.   Yes [provider]  furosemide (LASIX) 40 MG tablet Take 40 mg by mouth daily. 03/18/20  Yes [provider]  levalbuterol (XOPENEX) 1.25 MG/0.5ML nebulizer solution Take 1.25 mg by nebulization every 4 (four) hours as needed for wheezing or shortness of breath. After lunch   Yes [provider]  lisinopril (ZESTRIL) 10 MG tablet Take 10 mg by mouth daily.   Yes [provider]  LUMIGAN 0.01 % SOLN Place 1 drop into both eyes at bedtime. 12/23/21  Yes [provider]  OXYGEN Inhale 3 L into the lungs daily.   Yes [provider]  potassium chloride SA (KLOR-CON M) 20 MEQ tablet Take 20 mEq by mouth daily. 12/31/21  Yes [provider]  sertraline (ZOLOFT) 25 MG tablet Take 25 mg by mouth daily. 12/18/21  Yes [provider]  spironolactone (ALDACTONE) 25 MG tablet Take 12.5 mg by mouth daily. 12/18/21  Yes [provider]  Tiotropium Bromide-Olodaterol (STIOLTO RESPIMAT) 2.5-2.5 MCG/ACT AERS Inhale 2 puffs into the lungs daily. 05/06/21  Yes Ames DuraWalsh, Elizabeth  W, NP  loperamide (IMODIUM) 2 MG capsule Take 2 mg by mouth as needed for diarrhea or loose stools.    [provider]  OLANZapine (ZYPREXA) 2.5 MG tablet Take 1 tablet (2.5 mg total) by mouth at bedtime as needed (psychosis). 02/23/22   Jacalyn Lefevre, MD      Allergies    Patient has no known allergies.    Review of Systems   Review of Systems  Respiratory:  Positive for shortness of breath. Negative for cough.   Cardiovascular:  Negative for chest pain.  Gastrointestinal:  Negative for abdominal pain, diarrhea, nausea and vomiting.    Physical Exam Updated Vital Signs BP (!)  112/54 (BP Location: Left Arm)   Pulse 98   Temp 97.8 F (36.6 C) (Oral)   Resp (!) 25   Ht 5\' 4"  (1.626 m)   Wt 96.2 kg   SpO2 98%   BMI 36.39 kg/m  Physical Exam Vitals and nursing note reviewed.  Constitutional:      General: She is not in acute distress.    Appearance: She is not ill-appearing.  HENT:     Head: Atraumatic.  Eyes:     Conjunctiva/sclera: Conjunctivae normal.  Neck:     Vascular: No JVD.  Cardiovascular:     Rate and Rhythm: Normal rate and regular rhythm.     Pulses: Normal pulses.          Radial pulses are 2+ on the right side and 2+ on the left side.       Dorsalis pedis pulses are 2+ on the right side and 2+ on the left side.     Heart sounds: No murmur heard. Pulmonary:     Effort: Pulmonary effort is normal. No respiratory distress.     Breath sounds: Decreased breath sounds present.     Comments: No audible wheezing noted.  Patient appears tachypneic Abdominal:     General: Abdomen is flat. There is no distension.     Palpations: Abdomen is soft.     Tenderness: There is no abdominal tenderness.  Musculoskeletal:        General: Normal range of motion.     Cervical back: Normal range of motion.     Right lower leg: No edema.     Left lower leg: No edema.  Skin:    General: Skin is warm and dry.     Capillary Refill: Capillary refill takes less than 2 seconds.  Neurological:     General: No focal deficit present.     Mental Status: She is alert.  Psychiatric:        Mood and Affect: Mood normal.     ED Results / Procedures / Treatments   Labs (all labs ordered are listed, but only abnormal results are displayed) Labs Reviewed  BASIC METABOLIC PANEL - Abnormal; Notable for the following components:      Result Value   CO2 34 (*)    Glucose, Bld 121 (*)    All other components within normal limits  CBC WITH DIFFERENTIAL/PLATELET - Abnormal; Notable for the following components:   WBC 16.0 (*)    RBC 3.38 (*)    Hemoglobin 10.1 (*)     HCT 34.1 (*)    MCV 100.9 (*)    MCHC 29.6 (*)    Neutro Abs 15.0 (*)    Lymphs Abs 0.5 (*)    Abs Immature Granulocytes 0.09 (*)    All other components within normal limits  URINALYSIS, ROUTINE W REFLEX MICROSCOPIC - Abnormal; Notable for the following components:   APPearance HAZY (*)    Hgb urine dipstick MODERATE (*)    Ketones, ur 5 (*)    Protein, ur 30 (*)    Bacteria, UA MANY (*)    All other components within normal limits  CBC    EKG None  Radiology CT HEAD WO CONTRAST ( )  Result Date: 02/26/2022 CLINICAL DATA:  Altered mental status EXAM: CT HEAD WITHOUT CONTRAST TECHNIQUE: Contiguous axial images were obtained from the base of the skull through the vertex without intravenous contrast. RADIATION DOSE REDUCTION: This exam was performed according to the departmental dose-optimization program which includes automated exposure control, adjustment of the mA and/or kV according to patient size and/or use of iterative reconstruction technique. COMPARISON:  CT brain 01/13/2022 FINDINGS: Brain: No acute territorial infarction, hemorrhage or intracranial mass. Moderate hypodensity in the white matter consistent with chronic small vessel ischemic change. The ventricles are nonenlarged. Vascular: No hyperdense vessels.  Carotid vascular calcification Skull: Normal. Negative for fracture or focal lesion. Sinuses/Orbits: No acute finding. Other: None IMPRESSION: Mild motion degradation. No CT evidence for acute intracranial abnormality. Chronic small vessel ischemic changes of the white matter Electronically Signed   By: Jasmine Pang M.D.   On: 02/26/2022 20:23   DG Chest Portable 1 View  Result Date: 02/26/2022 CLINICAL DATA:  Shortness of breath, wheezing, COPD, hypertension, former smoker EXAM: PORTABLE CHEST 1 VIEW COMPARISON:  Portable exam 1508 hours compared to 02/23/2022 FINDINGS: Enlargement of cardiac silhouette. Mediastinal contours and pulmonary vascularity normal.  Atherosclerotic calcification aorta. Bibasilar atelectasis Subsegmental atelectasis in RIGHT upper lobe, slightly more nodular than appearance than on the prior study. No definite infiltrate, pleural effusion or pneumothorax. Bones demineralized. IMPRESSION: Bibasilar atelectasis as well as subsegmental atelectasis in RIGHT upper lobe. Enlargement of cardiac silhouette. Aortic Atherosclerosis (ICD10-I70.0). Electronically Signed   By: Ulyses Southward M.D.   On: 02/26/2022 15:30    Procedures Procedures    Medications Ordered in ED Medications  ipratropium-albuterol (DUONEB) 0.5-2.5 (3) MG/3ML nebulizer solution 3 mL (has no administration in time range)  aspirin EC tablet 81 mg (has no administration in time range)  famotidine (PEPCID) tablet 40 mg (has no administration in time range)  furosemide (LASIX) tablet 40 mg (has no administration in time range)  lisinopril (ZESTRIL) tablet 10 mg (has no administration in time range)  latanoprost (XALATAN) 0.005 % ophthalmic solution 1 drop (1 drop Both Eyes Given 02/26/22 2259)  enoxaparin (LOVENOX) injection 40 mg (has no administration in time range)  doxycycline (VIBRA-TABS) tablet 100 mg (100 mg Oral Given 02/26/22 2258)  methylPREDNISolone sodium succinate (SOLU-MEDROL) 125 mg/2 mL injection 60 mg (60 mg Intravenous Given 02/26/22 2257)    Followed by  predniSONE (DELTASONE) tablet 40 mg (has no administration in time range)  ipratropium-albuterol (DUONEB) 0.5-2.5 (3) MG/3ML nebulizer solution 3 mL (has no administration in time range)  acetaminophen (TYLENOL) tablet 650 mg (has no administration in time range)    Or  acetaminophen (TYLENOL) suppository 650 mg (has no administration in time range)  ondansetron (ZOFRAN) tablet 4 mg (has no administration in time range)    Or  ondansetron (ZOFRAN) injection 4 mg (has no administration in time range)  polyethylene glycol (MIRALAX / GLYCOLAX) packet 17 g (has no administration in time range)    ED  Course/ Medical Decision Making/ A&P  Medical Decision Making Amount and/or Complexity of Data Reviewed Labs: ordered. Radiology: ordered.  Risk Decision regarding hospitalization.   Social determinants of health:  Social History   Socioeconomic History   Marital status: Single    Spouse name: Not on file   Number of children: Not on file   Years of education: Not on file   Highest education level: Not on file  Occupational History   Not on file  Tobacco Use   Smoking status: Former    Packs/day: 1.00    Years: 20.00    Total pack years: 20.00    Types: Cigarettes    Quit date: 2020    Years since quitting: 3.5   Smokeless tobacco: Never  Vaping Use   Vaping Use: Never used  Substance and Sexual Activity   Alcohol use: Not Currently   Drug use: Never   Sexual activity: Not on file  Other Topics Concern   Not on file  Social History Narrative   Not on file   Social Determinants of Health   Financial Resource Strain: Not on file  Food Insecurity: Not on file  Transportation Needs: Not on file  Physical Activity: Not on file  Stress: Not on file  Social Connections: Not on file  Intimate Partner Violence: Not on file     Initial impression:  This patient presents to the ED for concern of shortness of breath and altered mental status, this involves an extensive number of treatment options, and is a complaint that carries with it a high risk of complications and morbidity.   Differentials include dementia, delirium, pneumonia, CHF exacerbation, COPD exacerbation.   Comorbidities affecting care:  COPD, paranoia  Additional history obtained: Discharge summary from ED visit 3 days ago, recent labs  Lab Tests  I Ordered, reviewed, and interpreted labs and EKG.  The pertinent results include:  Mild UTI noted on UA although improved from 3 days ago BMP unremarkable Leukocytosis of 16, increased from 14.43 days ago  Imaging Studies  ordered:  I ordered imaging studies including  Chest x-ray without acute findings, no focal consolidation I independently visualized and interpreted imaging and I agree with the radiologist interpretation.    Consultations Obtained:  I requested consultation with hospitalist,  and discussed lab and imaging findings as well as pertinent plan - they agreed to admit patient for AMS and increased white blood cell count  ED Course/Re-evaluation: Presents in no acute distress and is nontoxic-appearing.  Vitals without significant abnormality although respirations of 25.  She is not in any respiratory distress.  She speaking in full complete sentences with 2 L nasal cannula which is her baseline.  No audible wheezing.  Patient states that she does not know why she is here.  Paranoid comments noted during my examination.  Work-up was overall reassuring aside from leukocytosis of 16 which indicates increasing infection despite antibiotic therapy.  I spoke with patient's niece who helps care for patient, and niece indicates that patient is not being taking well care of at her facility.  Niece was told by provider at that facility that patient was discharged home from the hospital with "swelling in the brain" and that is why she is acting "crazy".  While there may be some underlying discontent with the facility that patient is at, given her rising leukocytosis and and that I personally witnessed some paranoid comments about everyone trying to kill her, I do believe that there is some degree of delirium going on.  Patient  admitted for AMS.  Disposition:  After consideration of the diagnostic results, physical exam, history and the patients response to treatment feel that the patent would benefit from admission.   Delirium: Plan and management as described above. Discharged home in good condition.  Final Clinical Impression(s) / ED Diagnoses Final diagnoses:  Delirium    Rx / DC Orders ED Discharge  Orders     None         Delight Ovens 02/26/22 2318    Pricilla Loveless, MD 03/02/22 [phone]

## 2022-02-26 NOTE — Assessment & Plan Note (Signed)
Stable. -Resume Lasix 40 mg daily, lisinopril 10 mg

## 2022-02-26 NOTE — Assessment & Plan Note (Signed)
O2 sats 95 to 100% on home 2 L.

## 2022-02-27 DIAGNOSIS — Z79899 Other long term (current) drug therapy: Secondary | ICD-10-CM | POA: Diagnosis not present

## 2022-02-27 DIAGNOSIS — Z7982 Long term (current) use of aspirin: Secondary | ICD-10-CM | POA: Diagnosis not present

## 2022-02-27 DIAGNOSIS — R262 Difficulty in walking, not elsewhere classified: Secondary | ICD-10-CM | POA: Diagnosis present

## 2022-02-27 DIAGNOSIS — I10 Essential (primary) hypertension: Secondary | ICD-10-CM | POA: Diagnosis present

## 2022-02-27 DIAGNOSIS — F0392 Unspecified dementia, unspecified severity, with psychotic disturbance: Secondary | ICD-10-CM | POA: Diagnosis not present

## 2022-02-27 DIAGNOSIS — E785 Hyperlipidemia, unspecified: Secondary | ICD-10-CM | POA: Diagnosis present

## 2022-02-27 DIAGNOSIS — R410 Disorientation, unspecified: Secondary | ICD-10-CM | POA: Diagnosis present

## 2022-02-27 DIAGNOSIS — J9811 Atelectasis: Secondary | ICD-10-CM | POA: Diagnosis present

## 2022-02-27 DIAGNOSIS — J9611 Chronic respiratory failure with hypoxia: Secondary | ICD-10-CM | POA: Diagnosis present

## 2022-02-27 DIAGNOSIS — Z9981 Dependence on supplemental oxygen: Secondary | ICD-10-CM | POA: Diagnosis not present

## 2022-02-27 DIAGNOSIS — J441 Chronic obstructive pulmonary disease with (acute) exacerbation: Secondary | ICD-10-CM | POA: Diagnosis present

## 2022-02-27 DIAGNOSIS — F03918 Unspecified dementia, unspecified severity, with other behavioral disturbance: Secondary | ICD-10-CM | POA: Diagnosis not present

## 2022-02-27 DIAGNOSIS — R600 Localized edema: Secondary | ICD-10-CM | POA: Diagnosis present

## 2022-02-27 DIAGNOSIS — Z87891 Personal history of nicotine dependence: Secondary | ICD-10-CM | POA: Diagnosis not present

## 2022-02-27 DIAGNOSIS — Z833 Family history of diabetes mellitus: Secondary | ICD-10-CM | POA: Diagnosis not present

## 2022-02-27 DIAGNOSIS — G4733 Obstructive sleep apnea (adult) (pediatric): Secondary | ICD-10-CM | POA: Diagnosis present

## 2022-02-27 LAB — CBC
HCT: 32.8 % — ABNORMAL LOW (ref 36.0–46.0)
Hemoglobin: 9.7 g/dL — ABNORMAL LOW (ref 12.0–15.0)
MCH: 29.5 pg (ref 26.0–34.0)
MCHC: 29.6 g/dL — ABNORMAL LOW (ref 30.0–36.0)
MCV: 99.7 fL (ref 80.0–100.0)
Platelets: 325 10*3/uL (ref 150–400)
RBC: 3.29 MIL/uL — ABNORMAL LOW (ref 3.87–5.11)
RDW: 12.9 % (ref 11.5–15.5)
WBC: 13.1 10*3/uL — ABNORMAL HIGH (ref 4.0–10.5)
nRBC: 0 % (ref 0.0–0.2)

## 2022-02-27 MED ORDER — SERTRALINE HCL 50 MG PO TABS
50.0000 mg | ORAL_TABLET | Freq: Every day | ORAL | Status: DC
  Administered 2022-02-27 – 2022-02-28 (×2): 50 mg via ORAL
  Filled 2022-02-27 (×2): qty 1

## 2022-02-27 MED ORDER — OLANZAPINE 5 MG PO TABS
2.5000 mg | ORAL_TABLET | Freq: Every day | ORAL | Status: DC
  Administered 2022-02-27: 2.5 mg via ORAL
  Filled 2022-02-27: qty 1

## 2022-02-27 MED ORDER — IPRATROPIUM-ALBUTEROL 0.5-2.5 (3) MG/3ML IN SOLN
3.0000 mL | Freq: Two times a day (BID) | RESPIRATORY_TRACT | Status: DC
  Administered 2022-02-27 – 2022-02-28 (×3): 3 mL via RESPIRATORY_TRACT
  Filled 2022-02-27 (×3): qty 3

## 2022-02-27 NOTE — TOC Progression Note (Addendum)
Transition of Care [facility]) - Progression Note    Patient Details  Name: Jody Elliott MRN: [id number] Date of Birth: [birth year]  [facility] CM/SW Contact  Annice Needy, LCSW Phone Number: 02/27/2022, 3:47 PM  Clinical Narrative:    CT scan and MMSE faxed to facility physician at [phone].   Expected Discharge Plan: Assisted Living Barriers to Discharge: Continued Medical Work up  Expected Discharge Plan and Services Expected Discharge Plan: Assisted Living                                               Social Determinants of Health (SDOH) Interventions    Readmission Risk Interventions     No data to display

## 2022-02-27 NOTE — Care Management Obs Status (Signed)
MEDICARE OBSERVATION STATUS NOTIFICATION   Patient Details  Name: Jody Elliott MRN: [id number] Date of Birth: 02-07-1941   Medicare Observation Status Notification Given:  Yes    Annice Needy, LCSW 02/27/2022, 10:42 AM

## 2022-02-27 NOTE — TOC Transition Note (Signed)
Transition of Care [facility]) - CM/SW Discharge Note   Patient Details  Name: Jody Elliott MRN: [id number] Date of Birth: 1941/07/22  [facility]) CM/SW Contact:  Annice Needy, LCSW Phone Number: 02/27/2022, 12:17 PM   Clinical Narrative:    Patient from [facility]F. Admitted for COPT exac. Uses a rollator and per daughter-in-law, Rachell Fackrell, patient is pretty independent. Patient has been experiencing delusional behavior and experiencing a decline in her cognition. Ms. Stankovic advised that the family plans for patient to return to the facility at d/c/  Spoke with Joaquin Courts, med tech at facility, discussed possible d/c tomorrow. She requested that d/c clinicals be faxed to [phone] and that the med tech on duty be called regarding d/c.     Barriers to Discharge: Continued Medical Work up   Patient Goals and CMS Choice Patient states their goals for this hospitalization and ongoing recovery are:: return to [place] [facility]      Discharge Placement                       Discharge Plan and Services                                     Social Determinants of Health (SDOH) Interventions     Readmission Risk Interventions     No data to display

## 2022-02-27 NOTE — Progress Notes (Signed)
PROGRESS NOTE    Jody Elliott  NOB:[id number] DOB: [birth year] DOA: 02/26/2022 PCP: Wilkie Aye, NP   Brief Narrative:    Jody Elliott is a 81 y.o. 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  Patient was brought to the ED from hospital has reports of change in mental status and difficulty breathing and unable to walk.  Patient was admitted with acute COPD exacerbation as well as acute paranoia of unknown significance.  She is being treated with IV steroids and breathing treatments and overall paranoia appears to be stable/improved.  Assessment & Plan:   Principal Problem:   COPD with acute exacerbation (HCC) Active Problems:   Acute paranoia (HCC)   OSA on CPAP   Chronic respiratory failure with hypoxia (HCC)   Bilateral lower extremity edema   HTN (hypertension)  Assessment and Plan:   COPD with acute exacerbation (HCC)-improving Bilateral expiratory wheezing with mild to moderate increased work of breathing..  No increase in O2 requirements.  Chest x-ray suggest atelectasis.  -DuoNebs as needed and scheduled -Doxycycline -IV Solu-Medrol 60 twice daily through today and then prednisone starting 7/15 -As needed mucolytic's   Acute paranoia (HCC) -CT head negative -No sign of UTI -Will need TOC and family to reach out to facility to see if there are any concerns   HTN (hypertension) Stable. -Resume Lasix 40 mg daily, lisinopril 10 mg   Bilateral lower extremity edema Chronic and unchanged.   - Resume Lasix 40 mg daily   Chronic respiratory failure with hypoxia (HCC) O2 sats 95 to 100% on home 2 L.   DVT prophylaxis: Lovenox Code Status: Full Family Communication: Tried calling niece with no response 7/14 Disposition Plan:  Status is: Observation The patient will require care spanning > 2 midnights and should be moved to inpatient because: IV medications.   Consultants:   None  Procedures:  None  Antimicrobials:  Anti-infectives (From admission, onward)    Start     Dose/Rate Route Frequency Ordered Stop   02/26/22 2300  doxycycline (VIBRA-TABS) tablet 100 mg        100 mg Oral Every 12 hours 02/26/22 2214 03/03/22 2159       Subjective: Patient seen and evaluated today with no new acute complaints or concerns. No acute concerns or events noted overnight.  She is alert and having breakfast, but cannot tell me where she is or the year.  Objective: Vitals:   02/27/22 0052 02/27/22 0128 02/27/22 0538 02/27/22 0807  BP:  96/65 (!) 154/65   Pulse: 91 89 88 88  Resp: 18 20 (!) 22 18  Temp:  98.2 F (36.8 C) 97.8 F (36.6 C)   TempSrc:      SpO2: 95% 95% 95% 97%  Weight:      Height:       No intake or output data in the 24 hours ending 02/27/22 0901 Filed Weights   02/26/22 1337  Weight: 96.2 kg    Examination:  General exam: Appears calm and comfortable  Respiratory system: Clear to auscultation. Respiratory effort normal.  2 L nasal cannula Cardiovascular system: S1 & S2 heard, RRR.  Gastrointestinal system: Abdomen is soft Central nervous system: Alert and awake Extremities: No edema Skin: No significant lesions noted Psychiatry: Flat affect.    Data Reviewed: I have personally reviewed following labs and imaging studies  CBC: Recent Labs  Lab 02/23/22 0053 02/26/22 1601 02/27/22 0543  WBC 14.3* 16.0*  13.1*  NEUTROABS  --  15.0*  --   HGB 9.3* 10.1* 9.7*  HCT 31.3* 34.1* 32.8*  MCV 99.7 100.9* 99.7  PLT 345 323 325   Basic Metabolic Panel: Recent Labs  Lab 02/23/22 0053 02/26/22 1601  NA 139 140  K 4.7 4.7  CL 98 100  CO2 34* 34*  GLUCOSE 110* 121*  BUN 21 22  CREATININE 0.88 0.79  CALCIUM 9.1 9.4   GFR: Estimated Creatinine Clearance: 62.1 mL/min (by C-G formula based on SCr of 0.79 mg/dL). Liver Function Tests: Recent Labs  Lab 02/23/22 0053  AST 15  ALT 16  ALKPHOS 74  BILITOT 0.4  PROT 6.3*   ALBUMIN 3.4*   No results for input(s): "LIPASE", "AMYLASE" in the last 168 hours. No results for input(s): "AMMONIA" in the last 168 hours. Coagulation Profile: No results for input(s): "INR", "PROTIME" in the last 168 hours. Cardiac Enzymes: No results for input(s): "CKTOTAL", "CKMB", "CKMBINDEX", "TROPONINI" in the last 168 hours. BNP (last 3 results) Recent Labs    03/25/21 1217  PROBNP 46.0   HbA1C: No results for input(s): "HGBA1C" in the last 72 hours. CBG: No results for input(s): "GLUCAP" in the last 168 hours. Lipid Profile: No results for input(s): "CHOL", "HDL", "LDLCALC", "TRIG", "CHOLHDL", "LDLDIRECT" in the last 72 hours. Thyroid Function Tests: No results for input(s): "TSH", "T4TOTAL", "FREET4", "T3FREE", "THYROIDAB" in the last 72 hours. Anemia Panel: No results for input(s): "VITAMINB12", "FOLATE", "FERRITIN", "TIBC", "IRON", "RETICCTPCT" in the last 72 hours. Sepsis Labs: No results for input(s): "PROCALCITON", "LATICACIDVEN" in the last 168 hours.  No results found for this or any previous visit (from the past 240 hour(s)).       Radiology Studies: CT HEAD WO CONTRAST ( )  Result Date: 02/26/2022 CLINICAL DATA:  Altered mental status EXAM: CT HEAD WITHOUT CONTRAST TECHNIQUE: Contiguous axial images were obtained from the base of the skull through the vertex without intravenous contrast. RADIATION DOSE REDUCTION: This exam was performed according to the departmental dose-optimization program which includes automated exposure control, adjustment of the mA and/or kV according to patient size and/or use of iterative reconstruction technique. COMPARISON:  CT brain 01/13/2022 FINDINGS: Brain: No acute territorial infarction, hemorrhage or intracranial mass. Moderate hypodensity in the white matter consistent with chronic small vessel ischemic change. The ventricles are nonenlarged. Vascular: No hyperdense vessels.  Carotid vascular calcification Skull: Normal.  Negative for fracture or focal lesion. Sinuses/Orbits: No acute finding. Other: None IMPRESSION: Mild motion degradation. No CT evidence for acute intracranial abnormality. Chronic small vessel ischemic changes of the white matter Electronically Signed   By: Jasmine Pang M.D.   On: 02/26/2022 20:23   DG Chest Portable 1 View  Result Date: 02/26/2022 CLINICAL DATA:  Shortness of breath, wheezing, COPD, hypertension, former smoker EXAM: PORTABLE CHEST 1 VIEW COMPARISON:  Portable exam 1508 hours compared to 02/23/2022 FINDINGS: Enlargement of cardiac silhouette. Mediastinal contours and pulmonary vascularity normal. Atherosclerotic calcification aorta. Bibasilar atelectasis Subsegmental atelectasis in RIGHT upper lobe, slightly more nodular than appearance than on the prior study. No definite infiltrate, pleural effusion or pneumothorax. Bones demineralized. IMPRESSION: Bibasilar atelectasis as well as subsegmental atelectasis in RIGHT upper lobe. Enlargement of cardiac silhouette. Aortic Atherosclerosis (ICD10-I70.0). Electronically Signed   By: Ulyses Southward M.D.   On: 02/26/2022 15:30        Scheduled Meds:  aspirin EC  81 mg Oral Daily   doxycycline  100 mg Oral Q12H   enoxaparin (LOVENOX) injection  40 mg  Subcutaneous Q24H   famotidine  40 mg Oral Daily   furosemide  40 mg Oral Daily   ipratropium-albuterol  3 mL Nebulization BID   latanoprost  1 drop Both Eyes QHS   lisinopril  10 mg Oral Daily   methylPREDNISolone (SOLU-MEDROL) injection  60 mg Intravenous Q12H   Followed by   Melene Muller ON 02/28/2022] predniSONE  40 mg Oral Q breakfast     LOS: 0 days    Time spent: 35 minutes    Hattie Aguinaldo Hoover Brunette, DO [facility]  If 7PM-7AM, please contact night-coverage [email or web address] 02/27/2022, 9:01 AM

## 2022-02-27 NOTE — Progress Notes (Signed)
This LPN assisted and provided a shower  on request. Pt tolerated well. No complaints of pain or discomfort, not SOB, dizziness. Pt's bed linen was changed and hygiene products were provided. Pt's O2 put back on after pt was put back in bed. PT's slip socks put on, bed put in lowest position and call bell placed in reach. Will continue to monitor.

## 2022-02-27 NOTE — Progress Notes (Signed)
Pt's daughter Rachell called to check on mother. Verified that she is listed as an emergency contact and Pt gave verbal permission to update. Discussed plan. And discharge ETA.Will continue to monitor

## 2022-02-28 DIAGNOSIS — J441 Chronic obstructive pulmonary disease with (acute) exacerbation: Secondary | ICD-10-CM | POA: Diagnosis not present

## 2022-02-28 LAB — BASIC METABOLIC PANEL
Anion gap: 6 (ref 5–15)
BUN: 24 mg/dL — ABNORMAL HIGH (ref 8–23)
CO2: 38 mmol/L — ABNORMAL HIGH (ref 22–32)
Calcium: 9.7 mg/dL (ref 8.9–10.3)
Chloride: 98 mmol/L (ref 98–111)
Creatinine, Ser: 0.9 mg/dL (ref 0.44–1.00)
GFR, Estimated: >60 mL/min (ref >60)
Glucose, Bld: 120 mg/dL — ABNORMAL HIGH (ref 70–99)
Potassium: 4.2 mmol/L (ref 3.5–5.1)
Sodium: 142 mmol/L (ref 135–145)

## 2022-02-28 LAB — CBC
HCT: 32.3 % — ABNORMAL LOW (ref 36.0–46.0)
Hemoglobin: 9.7 g/dL — ABNORMAL LOW (ref 12.0–15.0)
MCH: 30.1 pg (ref 26.0–34.0)
MCHC: 30 g/dL (ref 30.0–36.0)
MCV: 100.3 fL — ABNORMAL HIGH (ref 80.0–100.0)
Platelets: 333 10*3/uL (ref 150–400)
RBC: 3.22 MIL/uL — ABNORMAL LOW (ref 3.87–5.11)
RDW: 12.9 % (ref 11.5–15.5)
WBC: 18.6 10*3/uL — ABNORMAL HIGH (ref 4.0–10.5)
nRBC: 0 % (ref 0.0–0.2)

## 2022-02-28 LAB — MAGNESIUM: Magnesium: 2.2 mg/dL (ref 1.7–2.4)

## 2022-02-28 MED ORDER — OLANZAPINE 2.5 MG PO TABS: 2.5000 mg | ORAL_TABLET | Freq: Every day | ORAL | 0 refills | Status: AC

## 2022-02-28 MED ORDER — PREDNISONE 20 MG PO TABS: 40.0000 mg | ORAL_TABLET | Freq: Every day | ORAL | 0 refills | Status: AC

## 2022-02-28 MED ORDER — DOXYCYCLINE HYCLATE 100 MG PO TABS: 100.0000 mg | ORAL_TABLET | Freq: Two times a day (BID) | ORAL | 0 refills | Status: AC

## 2022-02-28 MED ORDER — SERTRALINE HCL 50 MG PO TABS: 50.0000 mg | ORAL_TABLET | Freq: Every day | ORAL | 0 refills | Status: AC

## 2022-02-28 NOTE — Discharge Summary (Signed)
Physician Discharge Summary  Jody Elliott MHD:[id number] DOB: [birth year] DOA: 02/26/2022  PCP: Wilkie Aye, NP  Admit date: 02/26/2022  Discharge date: 02/28/2022  Admitted From:ALF  Disposition:  ALF-memory care unit  Recommendations for Outpatient Follow-up:  Follow up with PCP in 1-2 weeks Continue prednisone and doxycycline as prescribed to finish course of treatment Use breathing treatments as needed for shortness of breath or wheezing Continue other home medications as prior with sertraline 50 mg daily and Zyprexa scheduled 2.5 mg nightly Patient will be admitted to memory care unit for further observation and care on account of behavioral disturbances suspected to be related to dementia after discussion with provider at facility  Home Health: None  Equipment/Devices: Has home 2 L nasal cannula  Discharge Condition:Stable  CODE STATUS: Full  Diet recommendation: Heart Healthy  Brief/Interim Summary: Jody Elliott is a 81 y.o. 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  Patient was brought to the ED from hospital has reports of change in mental status and difficulty breathing and unable to walk.  Patient was admitted with acute COPD exacerbation as well as acute paranoia of unknown significance.  Patient was treated with breathing treatments as well as IV steroids with improvement in respiratory condition.  CT head negative for any acute findings, but noted to have chronic white matter changes.  No sign of UTI noted either.  She appears to have behavioral disturbances related to dementia based on Mini-Mental status exam and CT head findings.  Case was discussed with provider at facility with recommendations to transfer to memory care unit and this will be arranged.  She will need to complete her course of treatment of prednisone and doxycycline as otherwise prescribed.  No other acute events noted throughout  the course of this admission.  Discharge Diagnoses:  Principal Problem:   COPD with acute exacerbation (HCC) Active Problems:   Acute paranoia (HCC)   OSA on CPAP   Chronic respiratory failure with hypoxia (HCC)   Bilateral lower extremity edema   HTN (hypertension)  Principal discharge diagnosis: Acute COPD exacerbation.  New diagnosis of dementia with behavioral disturbances.  Discharge Instructions  Discharge Instructions     Diet - low sodium heart healthy   Complete by: As directed    Increase activity slowly   Complete by: As directed       Allergies as of 02/28/2022   No Known Allergies      Medication List     TAKE these medications    acetaminophen 325 MG tablet Commonly known as: TYLENOL Take 650 mg by mouth in the morning, at noon, and at bedtime.   aspirin EC 81 MG tablet Take 81 mg by mouth daily. Swallow whole.   doxycycline 100 MG tablet Commonly known as: VIBRA-TABS Take 1 tablet (100 mg total) by mouth every 12 (twelve) hours for 4 days.   famotidine 40 MG tablet Commonly known as: PEPCID Take 40 mg by mouth daily.   ferrous sulfate 325 (65 FE) MG tablet Take 325 mg by mouth daily with breakfast.   furosemide 40 MG tablet Commonly known as: LASIX Take 40 mg by mouth daily.   levalbuterol 1.25 MG/0.5ML nebulizer solution Commonly known as: XOPENEX Take 1.25 mg by nebulization every 4 (four) hours as needed for wheezing or shortness of breath. After lunch   lisinopril 10 MG tablet Commonly known as: ZESTRIL Take 10 mg by mouth daily.   loperamide 2 MG  capsule Commonly known as: IMODIUM Take 2 mg by mouth as needed for diarrhea or loose stools.   Lumigan 0.01 % Soln Generic drug: bimatoprost Place 1 drop into both eyes at bedtime.   OLANZapine 2.5 MG tablet Commonly known as: ZyPREXA Take 1 tablet (2.5 mg total) by mouth at bedtime. What changed:  when to take this reasons to take this   OXYGEN Inhale 3 L into the lungs  daily.   potassium chloride SA 20 MEQ tablet Commonly known as: KLOR-CON M Take 20 mEq by mouth daily.   predniSONE 20 MG tablet Commonly known as: DELTASONE Take 2 tablets (40 mg total) by mouth daily with breakfast for 5 days. Start taking on: March 01, 2022   sertraline 50 MG tablet Commonly known as: ZOLOFT Take 1 tablet (50 mg total) by mouth daily. What changed:  medication strength how much to take   spironolactone 25 MG tablet Commonly known as: ALDACTONE Take 12.5 mg by mouth daily.   Stiolto Respimat 2.5-2.5 MCG/ACT Aers Generic drug: Tiotropium Bromide-Olodaterol Inhale 2 puffs into the lungs daily.   Vitamin D3 10 MCG (400 UNIT) Caps Take by mouth.        Contact information for follow-up providers     Wilkie Aye, NP. Schedule an appointment as soon as possible for a visit in 1 week(s).   Specialty: Adult Health Nurse Practitioner             Contact information for after-discharge care     Destination     [facility] .   Service: Assisted Living Contact information: [address] [place] [address] [phone]                    No Known Allergies  Consultations: None   Procedures/Studies: CT HEAD WO CONTRAST ( )  Result Date: 02/26/2022 CLINICAL DATA:  Altered mental status EXAM: CT HEAD WITHOUT CONTRAST TECHNIQUE: Contiguous axial images were obtained from the base of the skull through the vertex without intravenous contrast. RADIATION DOSE REDUCTION: This exam was performed according to the departmental dose-optimization program which includes automated exposure control, adjustment of the mA and/or kV according to patient size and/or use of iterative reconstruction technique. COMPARISON:  CT brain 01/13/2022 FINDINGS: Brain: No acute territorial infarction, hemorrhage or intracranial mass. Moderate hypodensity in the white matter consistent with chronic small vessel ischemic change.  The ventricles are nonenlarged. Vascular: No hyperdense vessels.  Carotid vascular calcification Skull: Normal. Negative for fracture or focal lesion. Sinuses/Orbits: No acute finding. Other: None IMPRESSION: Mild motion degradation. No CT evidence for acute intracranial abnormality. Chronic small vessel ischemic changes of the white matter Electronically Signed   By: Jasmine Pang M.D.   On: 02/26/2022 20:23   DG Chest Portable 1 View  Result Date: 02/26/2022 CLINICAL DATA:  Shortness of breath, wheezing, COPD, hypertension, former smoker EXAM: PORTABLE CHEST 1 VIEW COMPARISON:  Portable exam 1508 hours compared to 02/23/2022 FINDINGS: Enlargement of cardiac silhouette. Mediastinal contours and pulmonary vascularity normal. Atherosclerotic calcification aorta. Bibasilar atelectasis Subsegmental atelectasis in RIGHT upper lobe, slightly more nodular than appearance than on the prior study. No definite infiltrate, pleural effusion or pneumothorax. Bones demineralized. IMPRESSION: Bibasilar atelectasis as well as subsegmental atelectasis in RIGHT upper lobe. Enlargement of cardiac silhouette. Aortic Atherosclerosis (ICD10-I70.0). Electronically Signed   By: Ulyses Southward M.D.   On: 02/26/2022 15:30   DG Chest Port 1 View  Result Date: 02/23/2022 CLINICAL DATA:  Shortness  of breath EXAM: PORTABLE CHEST 1 VIEW COMPARISON:  01/13/2022 FINDINGS: Cardiomegaly with mild central congestion. Streaky atelectasis or scarring at the bases. Aortic atherosclerosis. No pleural effusion or pneumothorax. IMPRESSION: 1. Cardiomegaly with slight central congestion 2. Streaky atelectasis or scarring at the bases Electronically Signed   By: Jasmine Pang M.D.   On: 02/23/2022 01:36     Discharge Exam: Vitals:   02/28/22 0751 02/28/22 0905  BP:  127/63  Pulse:    Resp:    Temp:    SpO2: 99%    Vitals:   02/27/22 2115 02/27/22 2326 02/28/22 0751 02/28/22 0905  BP: 103/67   127/63  Pulse: (!) 102 88    Resp: 19 20     Temp: 98 F (36.7 C)     TempSrc: Oral     SpO2: 98% 93% 99%   Weight:      Height:        General: Pt is alert, awake, not in acute distress Cardiovascular: RRR, S1/S2 +, no rubs, no gallops Respiratory: CTA bilaterally, no wheezing, no rhonchi Abdominal: Soft, NT, ND, bowel sounds + Extremities: no edema, no cyanosis    The results of significant diagnostics from this hospitalization (including imaging, microbiology, ancillary and laboratory) are listed below for reference.     Microbiology: No results found for this or any previous visit (from the past 240 hour(s)).   Labs: BNP (last 3 results) Recent Labs    08/13/21 1255  BNP 573.0*   Basic Metabolic Panel: Recent Labs  Lab 02/23/22 0053 02/26/22 1601 02/28/22 0534  NA 139 140 142  K 4.7 4.7 4.2  CL 98 100 98  CO2 34* 34* 38*  GLUCOSE 110* 121* 120*  BUN 21 22 24*  CREATININE 0.88 0.79 0.90  CALCIUM 9.1 9.4 9.7  MG  --   --  2.2   Liver Function Tests: Recent Labs  Lab 02/23/22 0053  AST 15  ALT 16  ALKPHOS 74  BILITOT 0.4  PROT 6.3*  ALBUMIN 3.4*   No results for input(s): "LIPASE", "AMYLASE" in the last 168 hours. No results for input(s): "AMMONIA" in the last 168 hours. CBC: Recent Labs  Lab 02/23/22 0053 02/26/22 1601 02/27/22 0543 02/28/22 0534  WBC 14.3* 16.0* 13.1* 18.6*  NEUTROABS  --  15.0*  --   --   HGB 9.3* 10.1* 9.7* 9.7*  HCT 31.3* 34.1* 32.8* 32.3*  MCV 99.7 100.9* 99.7 100.3*  PLT 345 323 325 333   Cardiac Enzymes: No results for input(s): "CKTOTAL", "CKMB", "CKMBINDEX", "TROPONINI" in the last 168 hours. BNP: Invalid input(s): "POCBNP" CBG: No results for input(s): "GLUCAP" in the last 168 hours. D-Dimer No results for input(s): "DDIMER" in the last 72 hours. Hgb A1c No results for input(s): "HGBA1C" in the last 72 hours. Lipid Profile No results for input(s): "CHOL", "HDL", "LDLCALC", "TRIG", "CHOLHDL", "LDLDIRECT" in the last 72 hours. Thyroid function  studies No results for input(s): "TSH", "T4TOTAL", "T3FREE", "THYROIDAB" in the last 72 hours.  Invalid input(s): "FREET3" Anemia work up No results for input(s): "VITAMINB12", "FOLATE", "FERRITIN", "TIBC", "IRON", "RETICCTPCT" in the last 72 hours. Urinalysis    Component Value Date/Time   COLORURINE YELLOW 02/26/2022 1625   APPEARANCEUR HAZY (A) 02/26/2022 1625   LABSPEC 1.020 02/26/2022 1625   PHURINE 5.0 02/26/2022 1625   GLUCOSEU NEGATIVE 02/26/2022 1625   HGBUR MODERATE (A) 02/26/2022 1625   BILIRUBINUR NEGATIVE 02/26/2022 1625   KETONESUR 5 (A) 02/26/2022 1625   PROTEINUR 30 (A)  02/26/2022 1625   NITRITE NEGATIVE 02/26/2022 1625   LEUKOCYTESUR NEGATIVE 02/26/2022 1625   Sepsis Labs Recent Labs  Lab 02/23/22 0053 02/26/22 1601 02/27/22 0543 02/28/22 0534  WBC 14.3* 16.0* 13.1* 18.6*   Microbiology No results found for this or any previous visit (from the past 240 hour(s)).   Time coordinating discharge: 35 minutes  SIGNED:   Erick Blinks, DO [facility] 02/28/2022, 11:21 AM  If 7PM-7AM, please contact night-coverage [email or web address]

## 2022-02-28 NOTE — TOC Transition Note (Signed)
Transition of Care [facility]) - CM/SW Discharge Note   Patient Details  Name: Jody Elliott MRN: [id number] Date of Birth: October 21, 1940  [facility] CM/SW Contact:  Larrie Kass, LCSW Phone Number: 02/28/2022, 11:30 AM   Clinical Narrative:     TOC CSW spoke with [facility] , they reported pt can return back to facility today. [facility] is requesting pt's d/c paperwork to be set with the pt. CSW contacted pt's daughter Rochell Carbonneau [phone] and informed her of d/c plan.   Final next level of care: Memory Care Barriers to Discharge: No Barriers Identified   Patient Goals and CMS Choice Patient states their goals for this hospitalization and ongoing recovery are:: going back to facility CMS Medicare.gov Compare Post Acute Care list provided to:: Patient Represenative (must comment) Choice offered to / list presented to : Adult Children  Discharge Placement                Patient to be transferred to facility by: [facility]/ EMS Name of family member notified: Culmer,Rachell Patient and family notified of of transfer: 02/28/22  Discharge Plan and Services                                     Social Determinants of Health (SDOH) Interventions     Readmission Risk Interventions     No data to display

## 2022-02-28 NOTE — Progress Notes (Signed)
Patient discharged to [facility] house per [facility] transportation. Per Social worker patient family is aware and agrees to discharge.  All discharge instructions sent to facility per their request.

## 2022-03-16 ENCOUNTER — Emergency Department (HOSPITAL_COMMUNITY): Payer: Medicare Other

## 2022-03-16 ENCOUNTER — Encounter (HOSPITAL_COMMUNITY): Payer: Self-pay | Admitting: *Deleted

## 2022-03-16 ENCOUNTER — Emergency Department (HOSPITAL_COMMUNITY)
Admission: EM | Admit: 2022-03-16 | Discharge: 2022-03-16 | Disposition: A | Discharge status: Home / Self Care | Payer: Medicare Other | Attending: Emergency Medicine | Admitting: Emergency Medicine

## 2022-03-16 ENCOUNTER — Other Ambulatory Visit: Payer: Self-pay

## 2022-03-16 DIAGNOSIS — I509 Heart failure, unspecified: Secondary | ICD-10-CM | POA: Insufficient documentation

## 2022-03-16 DIAGNOSIS — I110 Hypertensive heart disease with heart failure: Secondary | ICD-10-CM | POA: Insufficient documentation

## 2022-03-16 DIAGNOSIS — R4689 Other symptoms and signs involving appearance and behavior: Secondary | ICD-10-CM

## 2022-03-16 DIAGNOSIS — R7981 Abnormal blood-gas level: Secondary | ICD-10-CM | POA: Diagnosis not present

## 2022-03-16 DIAGNOSIS — R410 Disorientation, unspecified: Secondary | ICD-10-CM | POA: Diagnosis present

## 2022-03-16 DIAGNOSIS — J449 Chronic obstructive pulmonary disease, unspecified: Secondary | ICD-10-CM | POA: Insufficient documentation

## 2022-03-16 DIAGNOSIS — F03918 Unspecified dementia, unspecified severity, with other behavioral disturbance: Secondary | ICD-10-CM | POA: Diagnosis not present

## 2022-03-16 DIAGNOSIS — Z7982 Long term (current) use of aspirin: Secondary | ICD-10-CM | POA: Diagnosis not present

## 2022-03-16 LAB — COMPREHENSIVE METABOLIC PANEL
ALT: 18 U/L (ref 0–44)
AST: 17 U/L (ref 15–41)
Albumin: 3.1 g/dL — ABNORMAL LOW (ref 3.5–5.0)
Alkaline Phosphatase: 57 U/L (ref 38–126)
Anion gap: 9 (ref 5–15)
BUN: 32 mg/dL — ABNORMAL HIGH (ref 8–23)
CO2: 38 mmol/L — ABNORMAL HIGH (ref 22–32)
Calcium: 9.4 mg/dL (ref 8.9–10.3)
Chloride: 98 mmol/L (ref 98–111)
Creatinine, Ser: 1.05 mg/dL — ABNORMAL HIGH (ref 0.44–1.00)
GFR, Estimated: 53 mL/min — ABNORMAL LOW (ref >60)
Glucose, Bld: 85 mg/dL (ref 70–99)
Potassium: 4.5 mmol/L (ref 3.5–5.1)
Sodium: 145 mmol/L (ref 135–145)
Total Bilirubin: 0.6 mg/dL (ref 0.3–1.2)
Total Protein: 6.2 g/dL — ABNORMAL LOW (ref 6.5–8.1)

## 2022-03-16 LAB — CBC WITH DIFFERENTIAL/PLATELET
Abs Immature Granulocytes: 0.09 10*3/uL — ABNORMAL HIGH (ref 0.00–0.07)
Basophils Absolute: 0 10*3/uL (ref 0.0–0.1)
Basophils Relative: 0 %
Eosinophils Absolute: 0 10*3/uL (ref 0.0–0.5)
Eosinophils Relative: 0 %
HCT: 36.8 % (ref 36.0–46.0)
Hemoglobin: 10.4 g/dL — ABNORMAL LOW (ref 12.0–15.0)
Immature Granulocytes: 1 %
Lymphocytes Relative: 5 %
Lymphs Abs: 0.8 10*3/uL (ref 0.7–4.0)
MCH: 29.5 pg (ref 26.0–34.0)
MCHC: 28.3 g/dL — ABNORMAL LOW (ref 30.0–36.0)
MCV: 104.2 fL — ABNORMAL HIGH (ref 80.0–100.0)
Monocytes Absolute: 1.7 10*3/uL — ABNORMAL HIGH (ref 0.1–1.0)
Monocytes Relative: 11 %
Neutro Abs: 13.3 10*3/uL — ABNORMAL HIGH (ref 1.7–7.7)
Neutrophils Relative %: 83 %
Platelets: 345 10*3/uL (ref 150–400)
RBC: 3.53 MIL/uL — ABNORMAL LOW (ref 3.87–5.11)
RDW: 12.5 % (ref 11.5–15.5)
WBC: 16 10*3/uL — ABNORMAL HIGH (ref 4.0–10.5)
nRBC: 0 % (ref 0.0–0.2)

## 2022-03-16 LAB — URINALYSIS, ROUTINE W REFLEX MICROSCOPIC
Bilirubin Urine: NEGATIVE
Glucose, UA: NEGATIVE mg/dL
Ketones, ur: NEGATIVE mg/dL
Nitrite: NEGATIVE
Protein, ur: NEGATIVE mg/dL
Specific Gravity, Urine: 1.019 (ref 1.005–1.030)
pH: 5 (ref 5.0–8.0)

## 2022-03-16 LAB — BLOOD GAS, VENOUS
Acid-Base Excess: 19.9 mmol/L — ABNORMAL HIGH (ref 0.0–2.0)
Bicarbonate: 48.4 mmol/L — ABNORMAL HIGH (ref 20.0–28.0)
Drawn by: 6892
FIO2: 32 %
O2 Saturation: 47.8 %
Patient temperature: 37
pCO2, Ven: 80 mmHg (ref 44–60)
pH, Ven: 7.39 (ref 7.25–7.43)
pO2, Ven: <31 mmHg — CL (ref 32–45)

## 2022-03-16 LAB — MAGNESIUM: Magnesium: 2.5 mg/dL — ABNORMAL HIGH (ref 1.7–2.4)

## 2022-03-16 NOTE — ED Notes (Signed)
[facility] called for transport 

## 2022-03-16 NOTE — ED Notes (Signed)
Spoke with Marylene Land (residency care coordinator of [facility]). Discussed d/c instructions and she is in agreement for the pt being taken back to [facility]

## 2022-03-16 NOTE — ED Provider Notes (Signed)
[facility] EMERGENCY DEPARTMENT Provider Note   CSN: [id number] Arrival date & time: 03/16/22  1151     History  Chief Complaint  Patient presents with   Failure To Thrive    Almas Rake is a 81 y.o. female.  HPI Patient presents for concern of failure to thrive.  Medical history includes HTN, COPD, CHF, depression, GERD, OSA, HLD.  She had a recent hospital admission in [place] for acute on chronic hypoxic and hypercarbic respiratory failure.  During this hospital admission, she did require NIPPV.  She had metabolic encephalopathy that was attributed to respiratory failure.  She was treated with steroids, antibiotics, and breathing treatments.  She returned to her baseline oxygen requirement of 3 L.  She has presumed dementia.  Patient currently has no complaints.  She is unaware of why she was brought to the ED.  History per facility: Patient returned from her hospital stay yesterday.  Since her return to her nursing facility, patient has had continued confusion.  When she becomes confused, she removes her oxygen.  This was discussed with her onsite doctor who advised bringing her into the ED.    Home Medications Prior to Admission medications   Medication Sig Start Date End Date Taking? Authorizing Provider  acetaminophen (TYLENOL) 325 MG tablet Take 650 mg by mouth in the morning, at noon, and at bedtime.    [provider]  aspirin EC 81 MG tablet Take 81 mg by mouth daily. Swallow whole.    [provider]  Cholecalciferol (VITAMIN D3) 10 MCG (400 UNIT) CAPS Take by mouth.    [provider]  famotidine (PEPCID) 40 MG tablet Take 40 mg by mouth daily. 02/10/21   [provider]  ferrous sulfate 325 (65 FE) MG tablet Take 325 mg by mouth daily with breakfast.    [provider]  furosemide (LASIX) 40 MG tablet Take 40 mg by mouth daily. 03/18/20   [provider]  levalbuterol (XOPENEX) 1.25 MG/0.5ML nebulizer solution Take  1.25 mg by nebulization every 4 (four) hours as needed for wheezing or shortness of breath. After lunch    [provider]  lisinopril (ZESTRIL) 10 MG tablet Take 10 mg by mouth daily.    [provider]  loperamide (IMODIUM) 2 MG capsule Take 2 mg by mouth as needed for diarrhea or loose stools.    [provider]  LUMIGAN 0.01 % SOLN Place 1 drop into both eyes at bedtime. 12/23/21   [provider]  OLANZapine (ZYPREXA) 2.5 MG tablet Take 1 tablet (2.5 mg total) by mouth at bedtime. 02/28/22 03/30/22  Sherryll Burger, Pratik D, DO  OXYGEN Inhale 3 L into the lungs daily.    [provider]  potassium chloride SA (KLOR-CON M) 20 MEQ tablet Take 20 mEq by mouth daily. 12/31/21   [provider]  sertraline (ZOLOFT) 50 MG tablet Take 1 tablet (50 mg total) by mouth daily. 02/28/22 03/30/22  Sherryll Burger, Pratik D, DO  spironolactone (ALDACTONE) 25 MG tablet Take 12.5 mg by mouth daily. 12/18/21   [provider]  Tiotropium Bromide-Olodaterol (STIOLTO RESPIMAT) 2.5-2.5 MCG/ACT AERS Inhale 2 puffs into the lungs daily. 05/06/21   Glenford Bayley, NP      Allergies    Patient has no known allergies.    Review of Systems   Review of Systems  Unable to perform ROS: Dementia  Psychiatric/Behavioral:  Positive for confusion (Per staff at facility).     Physical Exam Updated Vital  Signs BP 111/71   Pulse 84   Temp 98.9 F (37.2 C) (Oral)   Resp 18   Ht 5\' 4"  (1.626 m)   Wt 96.2 kg   SpO2 98%   BMI 36.39 kg/m  Physical Exam Vitals and nursing note reviewed.  Constitutional:      General: She is not in acute distress.    Appearance: Normal appearance. She is well-developed. She is ill-appearing (Chronically). She is not toxic-appearing or diaphoretic.  HENT:     Head: Normocephalic and atraumatic.     Right Ear: External ear normal.     Left Ear: External ear normal.     Nose: Nose normal.     Mouth/Throat:     Mouth: Mucous membranes are moist.      Pharynx: Oropharynx is clear.  Eyes:     General: No scleral icterus.    Extraocular Movements: Extraocular movements intact.     Conjunctiva/sclera: Conjunctivae normal.  Cardiovascular:     Rate and Rhythm: Normal rate and regular rhythm.     Heart sounds: No murmur heard. Pulmonary:     Effort: Pulmonary effort is normal. No respiratory distress.     Breath sounds: Normal breath sounds. No wheezing or rales.  Chest:     Chest wall: No tenderness.  Abdominal:     General: There is no distension.     Palpations: Abdomen is soft.     Tenderness: There is no abdominal tenderness.  Musculoskeletal:        General: No swelling. Normal range of motion.     Cervical back: Normal range of motion and neck supple. No rigidity.     Right lower leg: No edema.     Left lower leg: No edema.  Skin:    General: Skin is warm and dry.     Capillary Refill: Capillary refill takes less than 2 seconds.     Coloration: Skin is not jaundiced or pale.  Neurological:     General: No focal deficit present.     Mental Status: She is alert. She is disoriented.     Cranial Nerves: No cranial nerve deficit.     Sensory: No sensory deficit.     Motor: No weakness.     Coordination: Coordination normal.  Psychiatric:        Mood and Affect: Mood normal.        Behavior: Behavior normal.     ED Results / Procedures / Treatments   Labs (all labs ordered are listed, but only abnormal results are displayed) Labs Reviewed  COMPREHENSIVE METABOLIC PANEL - Abnormal; Notable for the following components:      Result Value   CO2 38 (*)    BUN 32 (*)    Creatinine, Ser 1.05 (*)    Total Protein 6.2 (*)    Albumin 3.1 (*)    GFR, Estimated 53 (*)    All other components within normal limits  CBC WITH DIFFERENTIAL/PLATELET - Abnormal; Notable for the following components:   WBC 16.0 (*)    RBC 3.53 (*)    Hemoglobin 10.4 (*)    MCV 104.2 (*)    MCHC 28.3 (*)    Neutro Abs 13.3 (*)    Monocytes  Absolute 1.7 (*)    Abs Immature Granulocytes 0.09 (*)    All other components within normal limits  URINALYSIS, ROUTINE W REFLEX MICROSCOPIC - Abnormal; Notable for the following components:   APPearance HAZY (*)    Hgb  urine dipstick SMALL (*)    Leukocytes,Ua MODERATE (*)    Bacteria, UA RARE (*)    All other components within normal limits  BLOOD GAS, VENOUS - Abnormal; Notable for the following components:   pCO2, Ven 80 (*)    pO2, Ven <31 (*)    Bicarbonate 48.4 (*)    Acid-Base Excess 19.9 (*)    All other components within normal limits  MAGNESIUM - Abnormal; Notable for the following components:   Magnesium 2.5 (*)    All other components within normal limits  URINE CULTURE    EKG EKG Interpretation  Date/Time:  Monday March 16 2022 13:18:56 EDT Ventricular Rate:  80 PR Interval:  120 QRS Duration: 122 QT Interval:  402 QTC Calculation: 463 R Axis:   61 Text Interpretation: Normal sinus rhythm Right bundle branch block Abnormal ECG Confirmed by Gloris Manchester ([id number]) on 03/16/2022 2:20:04 PM  Radiology CT HEAD WO CONTRAST  Result Date: 03/16/2022 CLINICAL DATA:  Mental status change, failure to thrive EXAM: CT HEAD WITHOUT CONTRAST TECHNIQUE: Contiguous axial images were obtained from the base of the skull through the vertex without intravenous contrast. RADIATION DOSE REDUCTION: This exam was performed according to the departmental dose-optimization program which includes automated exposure control, adjustment of the mA and/or kV according to patient size and/or use of iterative reconstruction technique. COMPARISON:  02/26/2022 FINDINGS: Brain: Generalized atrophy. Normal ventricular morphology. No midline shift or mass effect. Small vessel chronic ischemic changes of deep cerebral white matter. No intracranial hemorrhage, mass lesion, evidence of acute infarction, or extra-axial fluid collection. Vascular: No hyperdense vessels. Skull: Intact. Mildly expanded sella turcica  containing fluid attenuation Sinuses/Orbits: Clear Other: N/A IMPRESSION: Atrophy with small vessel chronic ischemic changes of deep cerebral white matter. No acute intracranial abnormalities. Electronically Signed   By: Ulyses Southward M.D.   On: 03/16/2022 14:12    Procedures Procedures    Medications Ordered in ED Medications - No data to display  ED Course/ Medical Decision Making/ A&P                           Medical Decision Making Amount and/or Complexity of Data Reviewed Labs: ordered. Radiology: ordered.   This patient presents to the ED for concern of confusion, this involves an extensive number of treatment options, and is a complaint that carries with it a high risk of complications and morbidity.  The differential diagnosis includes delirium, progression of dementia, infection, polypharmacy, medication withdrawal   Co morbidities that complicate the patient evaluation  HTN, COPD, CHF, depression, GERD, OSA, HLD, and presumed dementia   Additional history obtained:  Additional history obtained from N/A External records from outside source obtained and reviewed including EMR, paperwork from her recent hospitalization in [place]   Lab Tests:  I Ordered, and personally interpreted labs.  The pertinent results include: Baseline looks ptosis, baseline anemia, hypomagnesemia with otherwise normal electrolytes, compensated hypercarbia, equivocal urinalysis   Imaging Studies ordered:  I ordered imaging studies including CT head I independently visualized and interpreted imaging which showed no acute findings I agree with the radiologist interpretation  Problem List / ED Course / Critical interventions / Medication management  Patient is a 81 year old female presenting from nursing facility for reported confusion.  She had a recent hospitalization for acute on chronic hypoxic and hypercapnic respiratory failure.  She reportedly returned to her mental baseline and was  [facility] from [facility].  Nursing facility staff  states that she has had confusion and has been taking off her home oxygen.  When she does so, her SPO2 drops.  She was sent to the ED for further evaluation.  On assessment, patient is resting calmly and comfortably with nasal cannula in place.  Her baseline oxygen requirement is 3 L.  Currently she is on 2.  She denies any current physical complaints.  She is alert and oriented only to self.  Per review of her recent discharge paperwork, she was alert and oriented x4 on her day of discharge.  Change in mentation could be secondary to delirium.  There were reports of presumed dementia during her recent hospitalization.  Today, diagnostic work-up was initiated.  On EKG, patient has a right bundle branch block that is new since May.  On blood gas, she has a hypercarbia with a PCO2 of 80, however, this does appear to be compensated.  Patient has a leukocytosis, which does appear to be chronic.  She has no worsening of her anemia.  Magnesium is high but electrolytes are otherwise normal.  There was some mild bacteria and pyuria on urinalysis.  This was present on her recent hospitalization.  It was treated as asymptomatic bacteriuria and not treated with antibiotics.  Given absence of symptoms, will continue to defer antibiotics.  Urine cultures were sent.  On reassessment, patient remains calm and pleasant.  She is stable for discharge.  I notified her niece and legal guardian, Ms. Dorena Cookey.  Patient was discharged in stable condition.   Social Determinants of Health:  Lives in nursing facility         Final Clinical Impression(s) / ED Diagnoses Final diagnoses:  Altered behavior    Rx / DC Orders ED Discharge Orders     None         Gloris Manchester, MD 03/16/22 [id number]

## 2022-03-16 NOTE — Discharge Instructions (Signed)
The testing performed in the emergency department did not show any new findings.  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  Ensure that you wear your CPAP when sleeping.  Continue take medications as prescribed.  Follow-up with your primary care doctor who manages your care for any medication adjustments which may help with episodes of confusion.  Your urinalysis did not show clear evidence of infection.  A urine culture was sent.  Follow-up on results of urine culture in 2 days.  Return to the emergency department at any time for any new or worsening symptoms of concern.

## 2022-03-16 NOTE — ED Triage Notes (Signed)
Pt brought by [facility] for c/o failure to thrive The facility reported to ems pt has not been eating or takning her medications  Pt states she has no complaints, only right shoulder pain, pt denies any falls and states she has been taking her medications  Pt reports she has not been eating much due to decrease in appetite

## 2022-03-17 LAB — URINE CULTURE: Culture: <10000 — AB

## 2022-06-17 DEATH — deceased

## 2023-07-04 IMAGING — DX DG CHEST 2V
2 series · 2 of 2 positions shown · non-contrast
Comparison: November 08, 2020.

CLINICAL DATA: Wheezing; hx COPD and HF

EXAM:
CHEST - 2 VIEW

[chest pa]
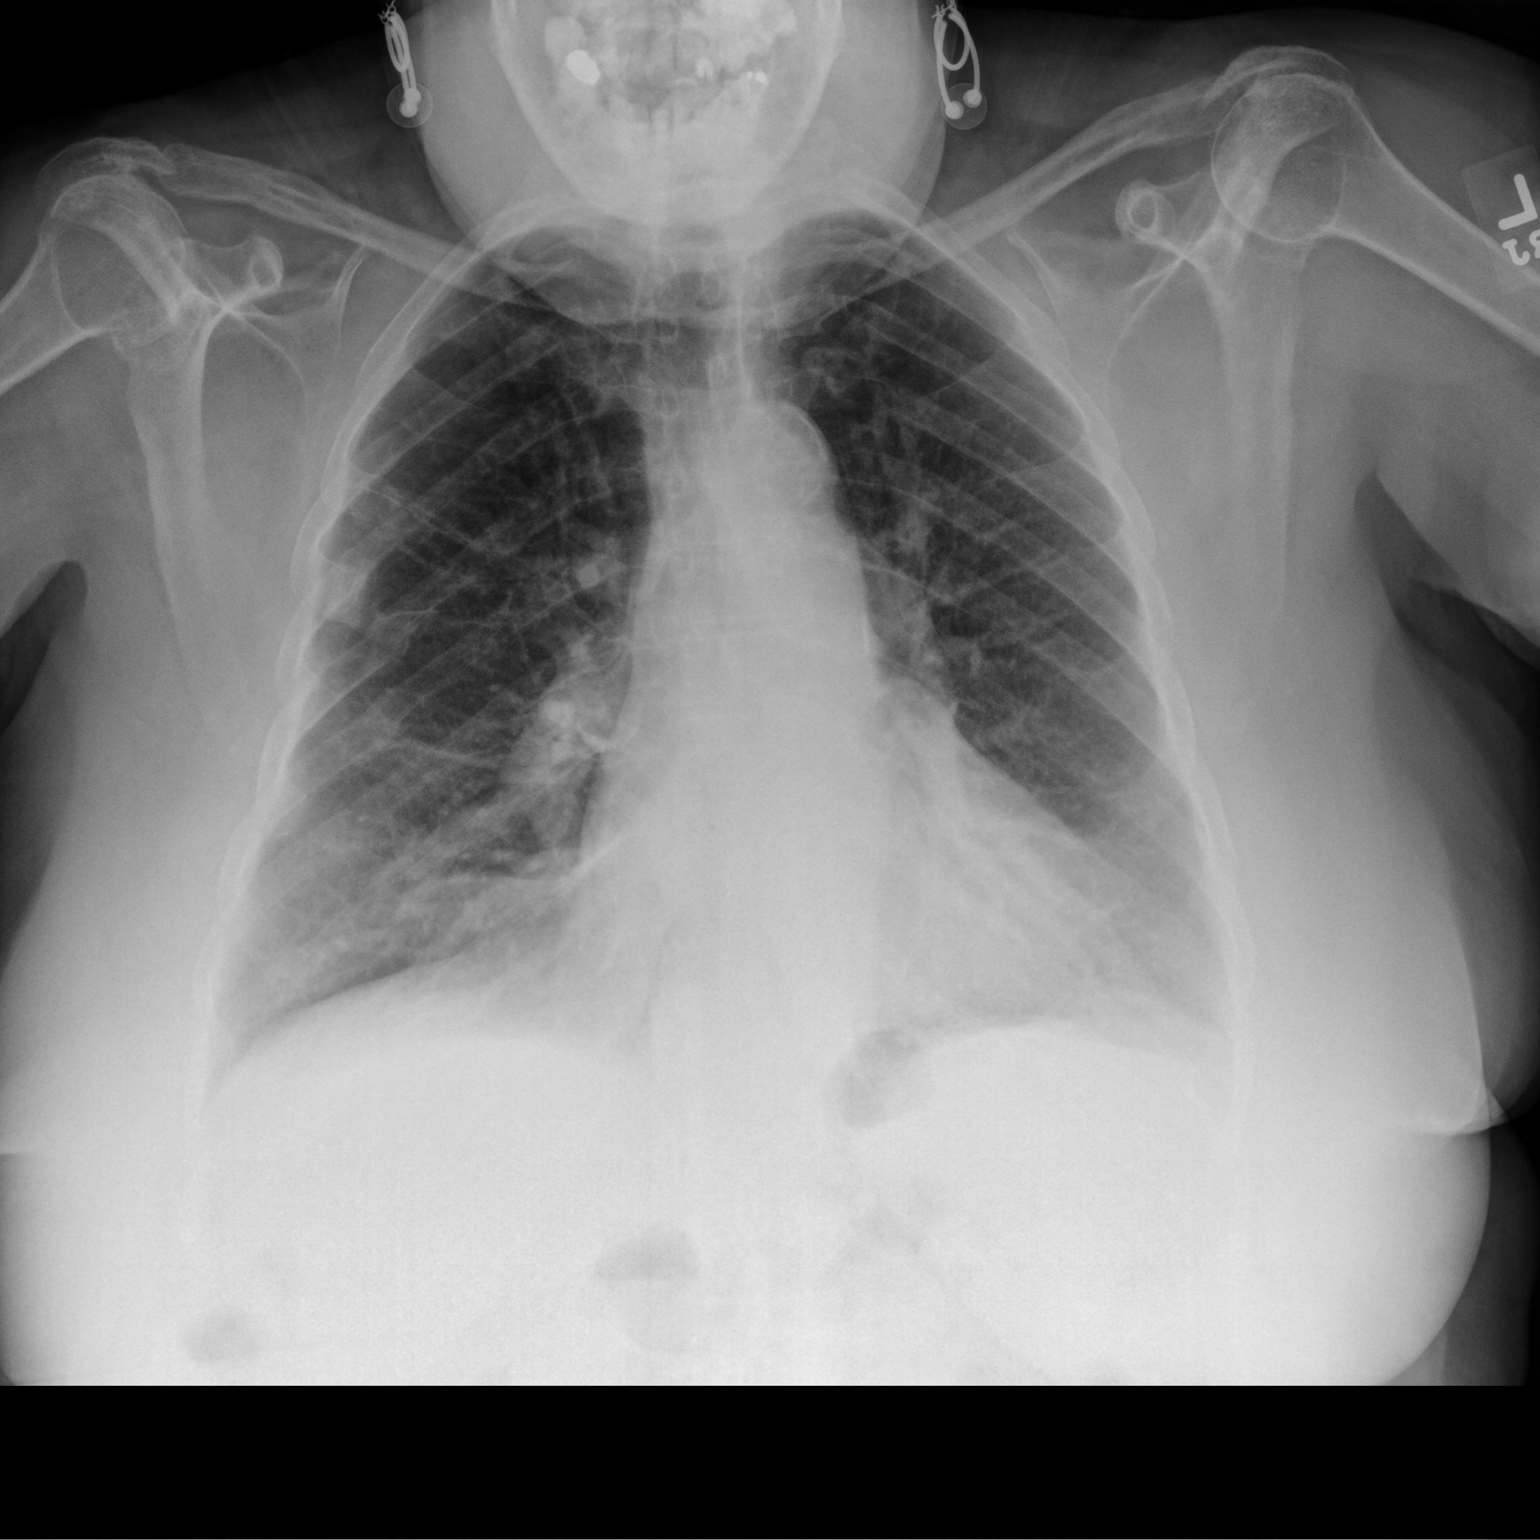

[chest lat]
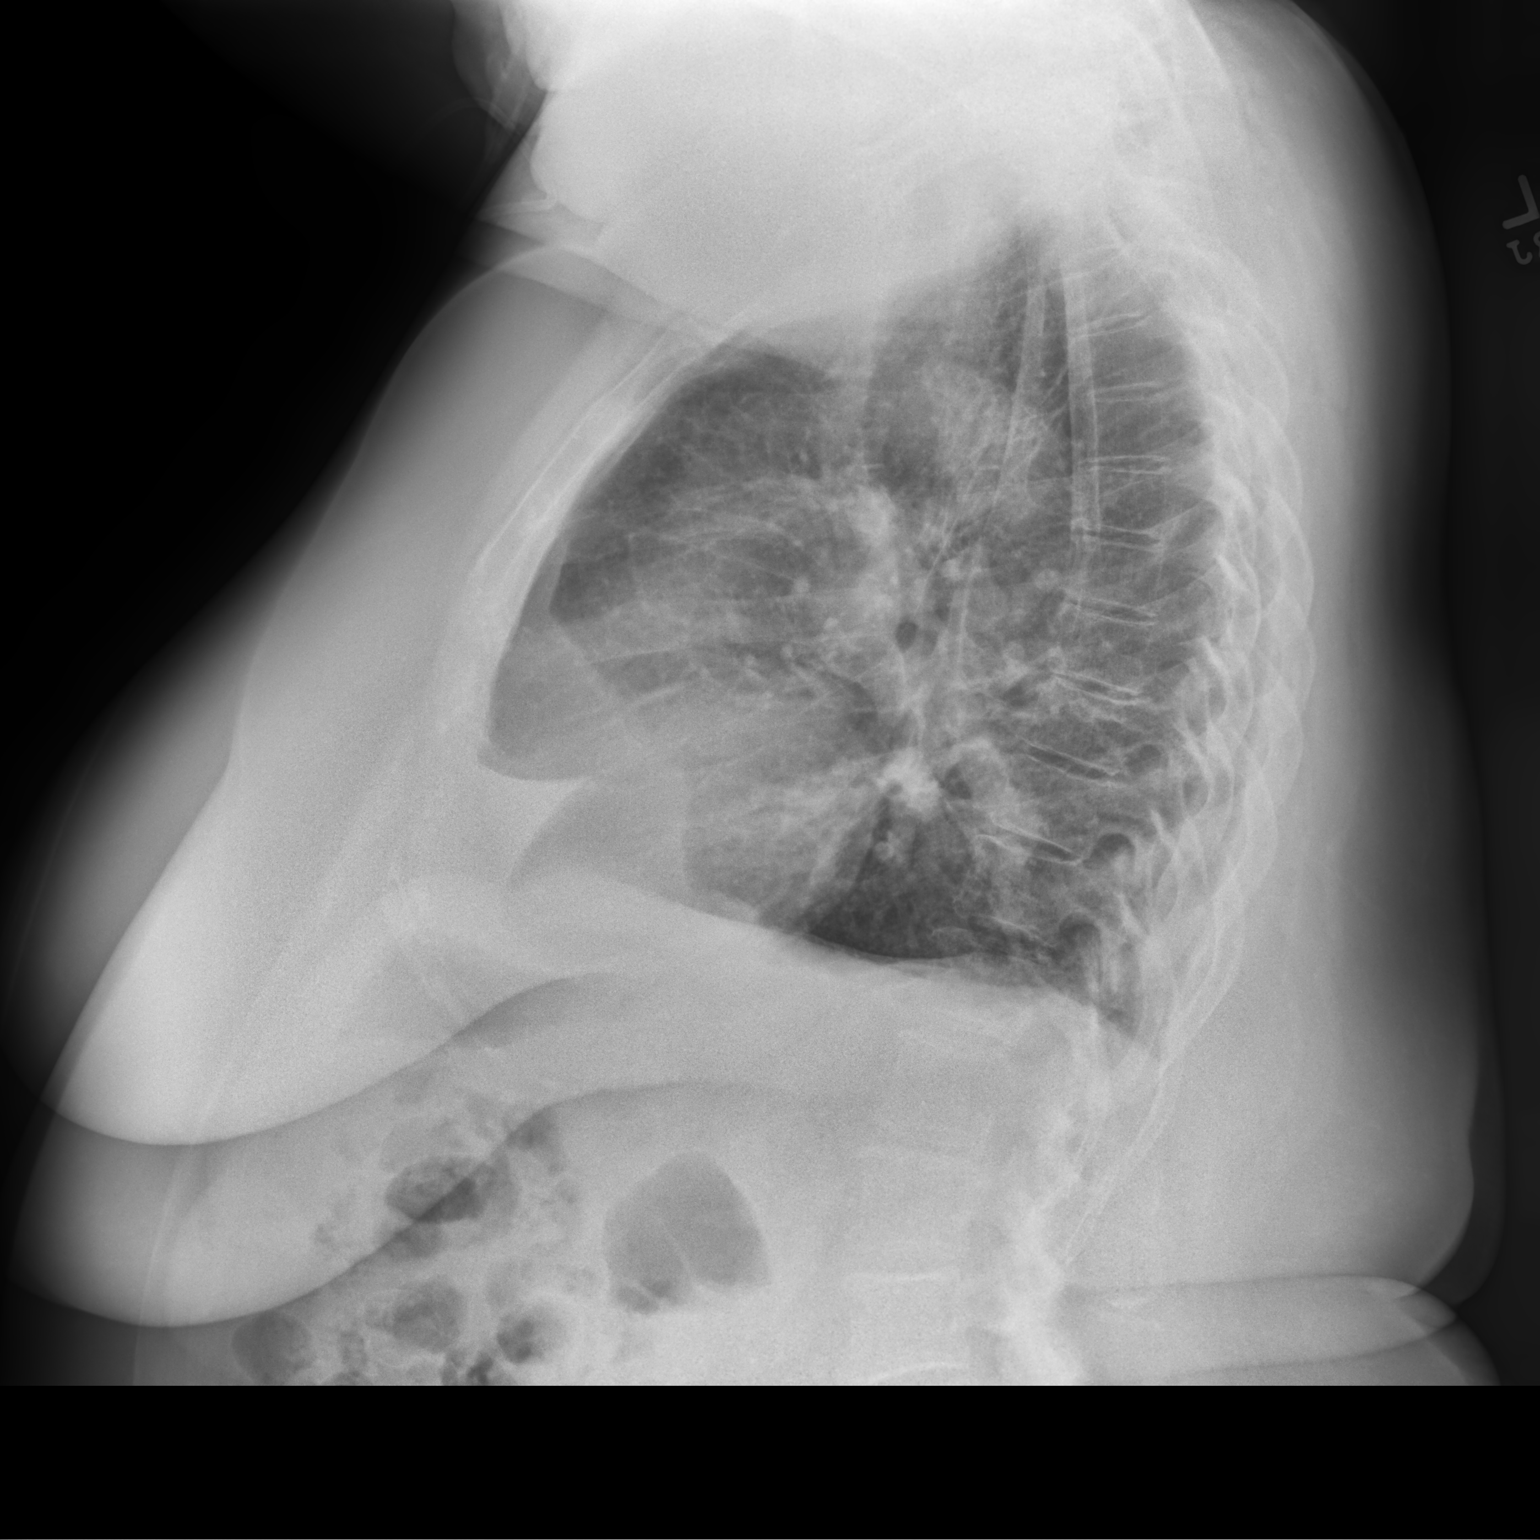

[2 of 2 positions shown; findings below may reference images not displayed]

FINDINGS: Similar mild enlargement of the cardiac silhouette. Similar mild
bibasilar opacities. Similar hyperinflation with flattening of the
diaphragms. No visible pneumothorax or pleural effusions.
Polyarticular degenerative change.
IMPRESSION: 1. Similar mild bibasilar opacities, which could represent
atelectasis, aspiration, and/or pneumonia.
2. Similar mild cardiomegaly.
3. Similar hyperinflation.

## 2024-04-23 IMAGING — CT CT HEAD W/O CM
4 series · 17 of 47 positions shown, 19 images · non-contrast
Comparison: None Available.

CLINICAL DATA: Trauma, fall



[Series 2: head w o · axial · 0.45mm/px · z∈[+46,+161]mm · 7 of 31 slices shown, 9 images]
[im 4/31  brain]
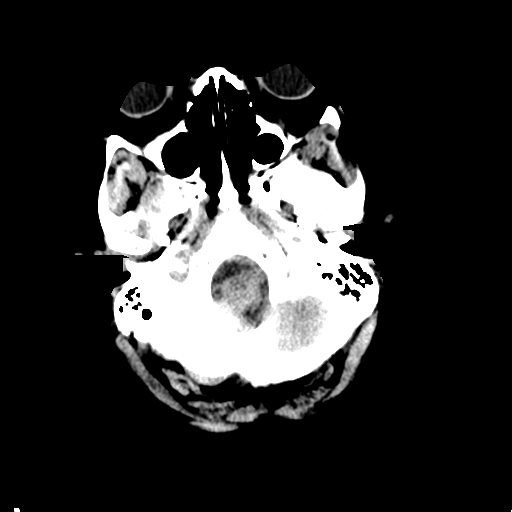
[im 4/31  bone]
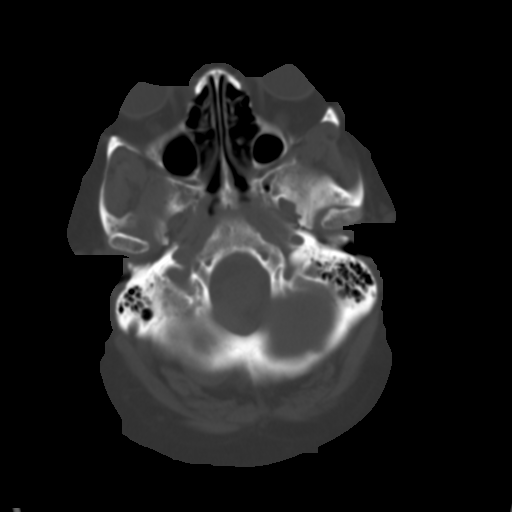
[im 8/31  brain]
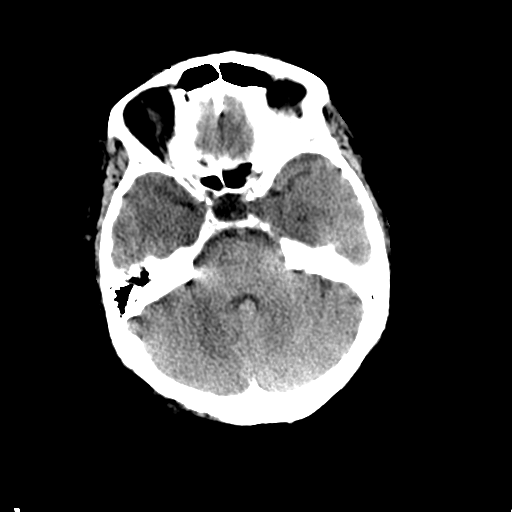
[im 12/31  brain]
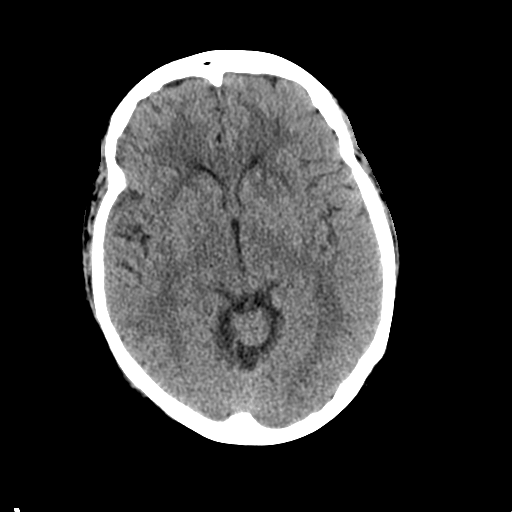
[im 16/31  brain]
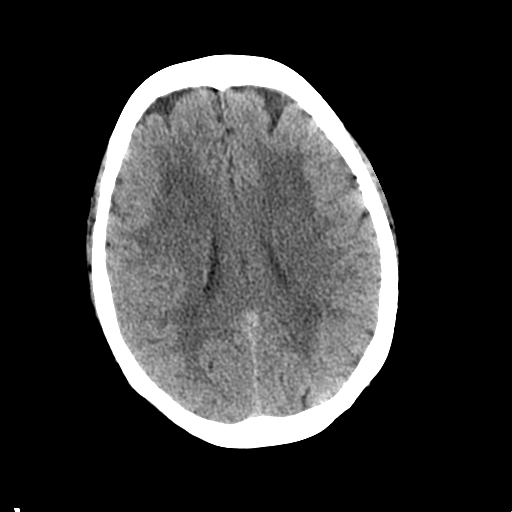
[im 19/31  brain]
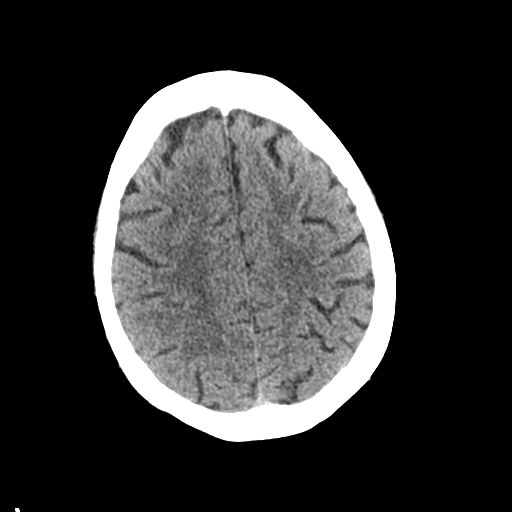
[im 19/31  bone]
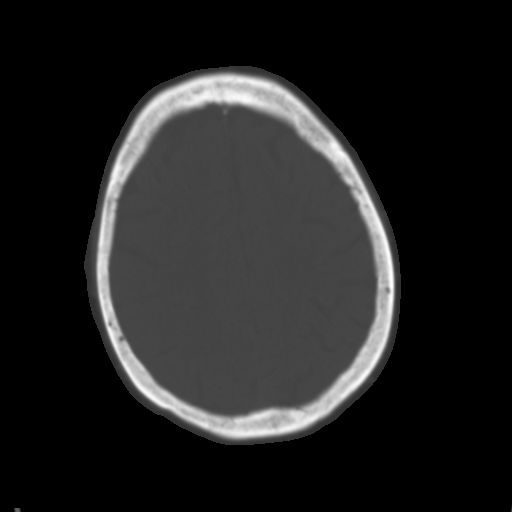
[im 23/31  brain]
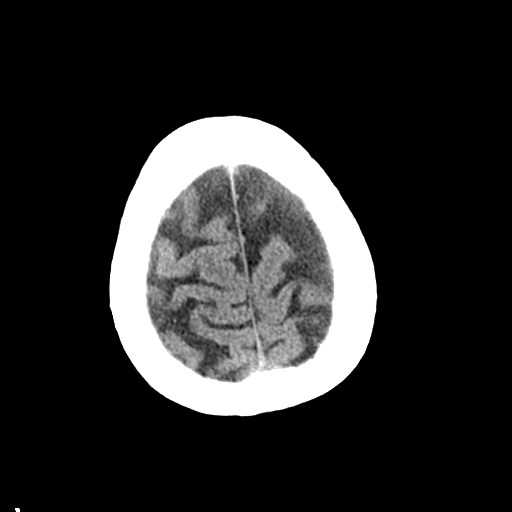
[im 27/31  brain]
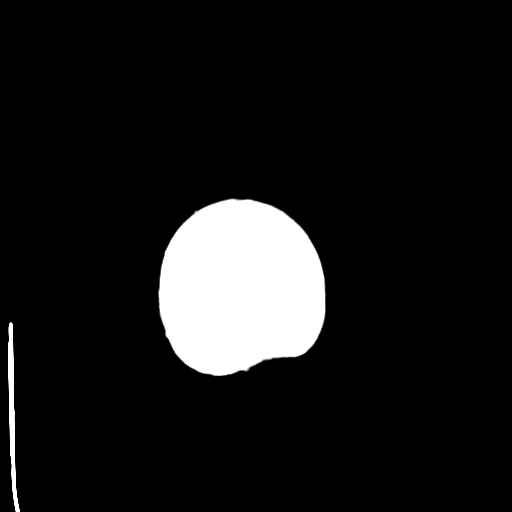

[Series 3: head bone · axial · 0.45mm/px · z∈[+45,+99]mm · 4 of 77 slices shown]
[im 8/77  bone]
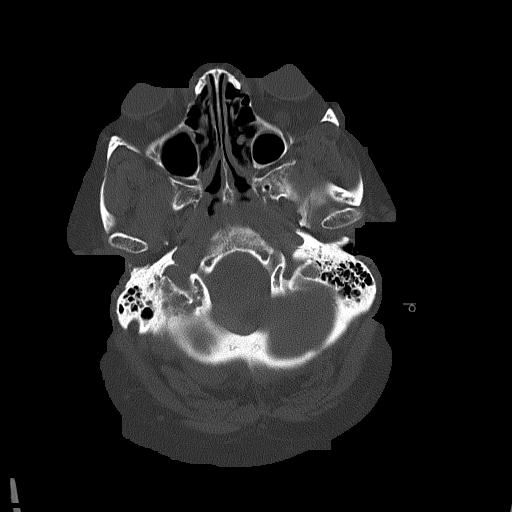
[im 16/77  bone]
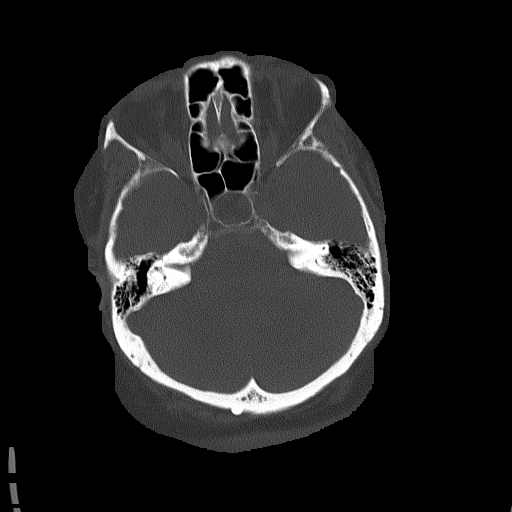
[im 23/77  bone]
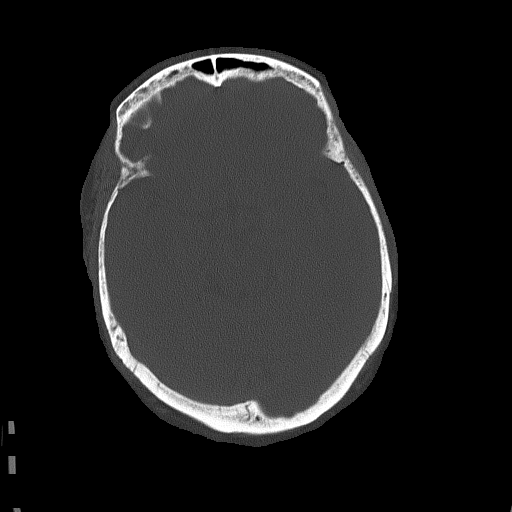
[im 35/77  bone]
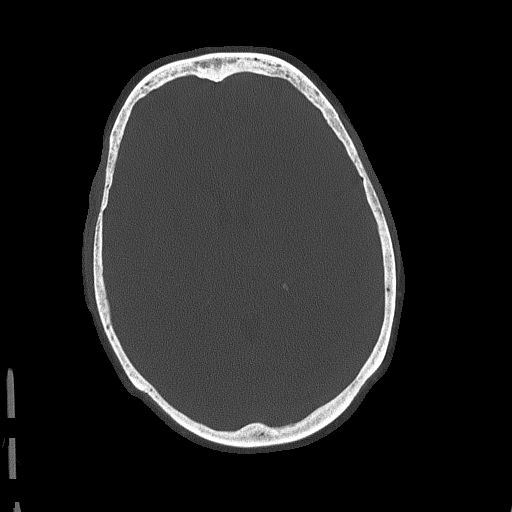

[Series 4: coronal soft · coronal · 0.33mm/px · 3 of 77 slices shown]
[im 26/77  brain]
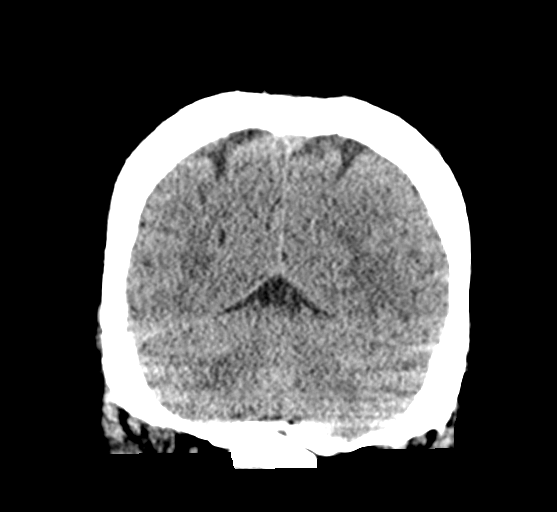
[im 34/77  brain]
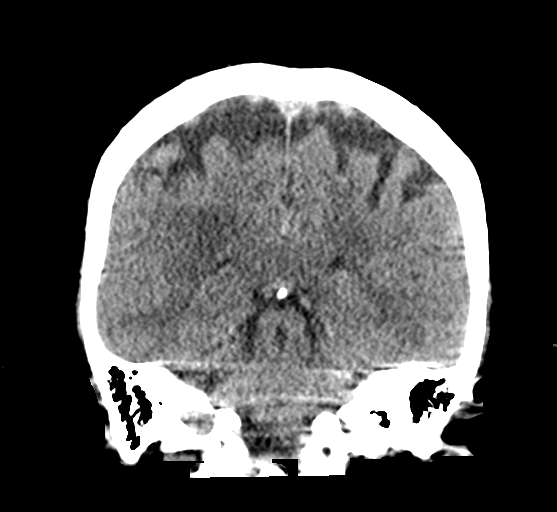
[im 43/77  brain]
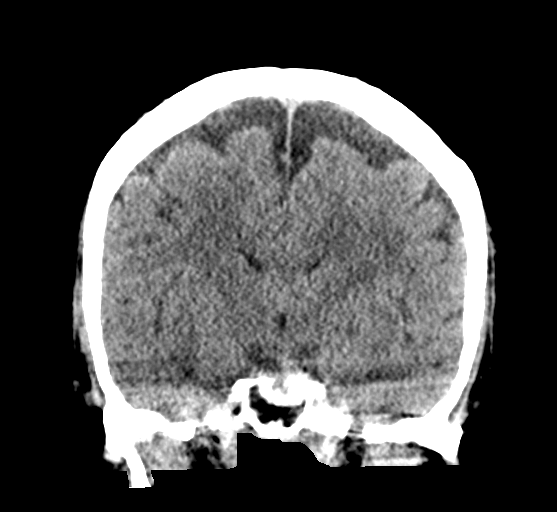

[Series 5: sagittal soft · sagittal · 0.35mm/px · 3 of 67 slices shown]
[im 23/67  brain]
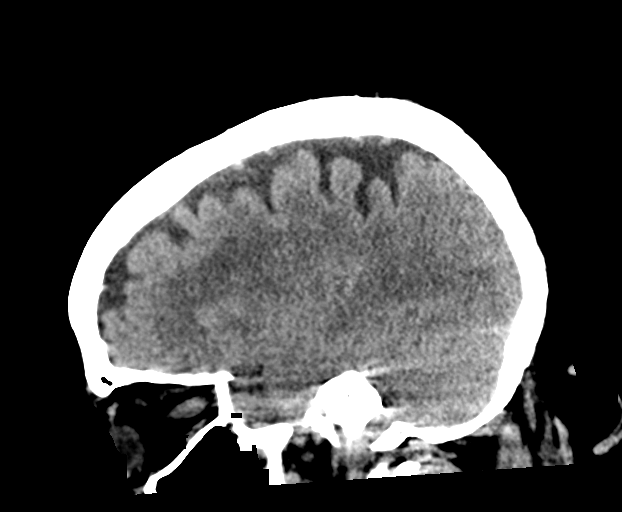
[im 34/67  brain]
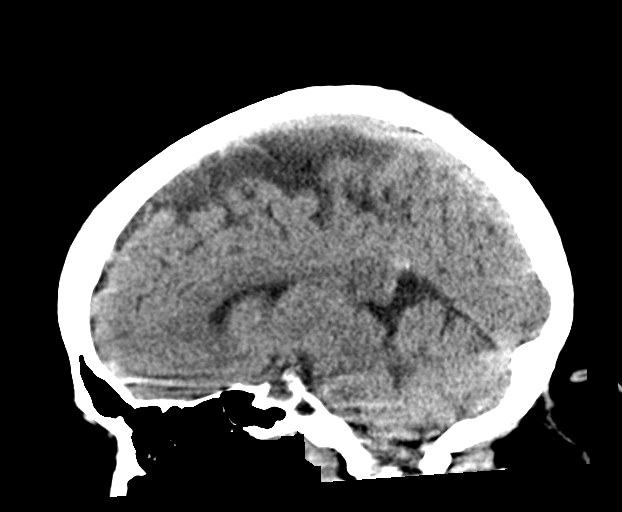
[im 45/67  brain]
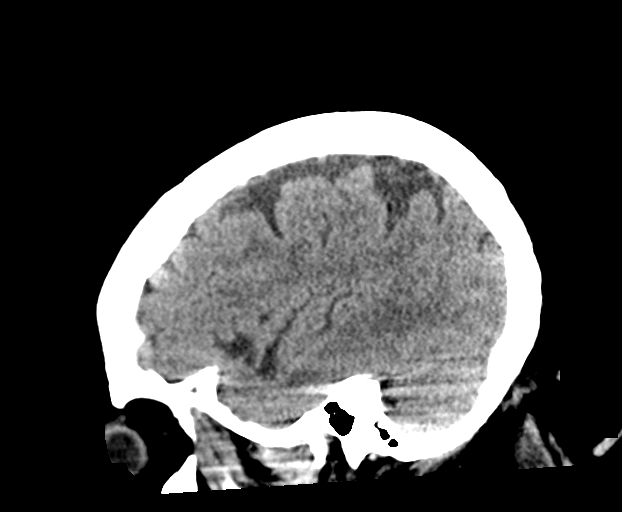

[17 of 47 positions shown; findings below may reference images not displayed]

FINDINGS: Brain: No acute intracranial findings are seen. There are no signs
of bleeding within the cranium. Ventricles are not dilated. Cortical
sulci are prominent.

Vascular: Unremarkable.

Skull: No fracture is seen.

Sinuses/Orbits: There are no air-fluid levels in the paranasal
sinuses. Exophthalmos is seen on both sides.

Other: None.
IMPRESSION: No acute intracranial findings are seen in noncontrast CT brain.
Atrophy.
